# Patient Record
Sex: Female | Born: 1990 | Race: White | Hispanic: No | Marital: Single | Smoking: Former smoker
Health system: Southern US, Community
[De-identification: ages and names within clinical notes are randomized; demographics above are authoritative.]

## PROBLEM LIST (undated history)

## (undated) ENCOUNTER — Inpatient Hospital Stay (HOSPITAL_COMMUNITY): Payer: Self-pay

## (undated) DIAGNOSIS — M1990 Unspecified osteoarthritis, unspecified site: Secondary | ICD-10-CM

## (undated) DIAGNOSIS — I739 Peripheral vascular disease, unspecified: Secondary | ICD-10-CM

## (undated) DIAGNOSIS — Z9889 Other specified postprocedural states: Secondary | ICD-10-CM

## (undated) DIAGNOSIS — D649 Anemia, unspecified: Secondary | ICD-10-CM

## (undated) DIAGNOSIS — R51 Headache: Secondary | ICD-10-CM

## (undated) DIAGNOSIS — F32A Depression, unspecified: Secondary | ICD-10-CM

## (undated) DIAGNOSIS — O2690 Pregnancy related conditions, unspecified, unspecified trimester: Secondary | ICD-10-CM

## (undated) DIAGNOSIS — R112 Nausea with vomiting, unspecified: Secondary | ICD-10-CM

## (undated) DIAGNOSIS — F419 Anxiety disorder, unspecified: Secondary | ICD-10-CM

## (undated) DIAGNOSIS — O24419 Gestational diabetes mellitus in pregnancy, unspecified control: Secondary | ICD-10-CM

## (undated) DIAGNOSIS — F329 Major depressive disorder, single episode, unspecified: Secondary | ICD-10-CM

## (undated) HISTORY — PX: OTHER SURGICAL HISTORY: SHX169

## (undated) HISTORY — DX: Gestational diabetes mellitus in pregnancy, unspecified control: O24.419

## (undated) HISTORY — PX: MOUTH SURGERY: SHX715

## (undated) HISTORY — DX: Other specified postprocedural states: Z98.890

## (undated) HISTORY — DX: Nausea with vomiting, unspecified: R11.2

## (undated) HISTORY — PX: ANKLE RECONSTRUCTION: SHX1151

---
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
## 2010-07-18 ENCOUNTER — Other Ambulatory Visit (HOSPITAL_COMMUNITY)
Admission: RE | Admit: 2010-07-18 | Discharge: 2010-07-18 | Disposition: A | Discharge status: Home / Self Care | Payer: Medicaid Other | Source: Ambulatory Visit | Attending: Obstetrics and Gynecology | Admitting: Obstetrics and Gynecology

## 2010-07-18 DIAGNOSIS — Z113 Encounter for screening for infections with a predominantly sexual mode of transmission: Secondary | ICD-10-CM | POA: Insufficient documentation

## 2010-07-18 DIAGNOSIS — Z124 Encounter for screening for malignant neoplasm of cervix: Secondary | ICD-10-CM | POA: Insufficient documentation

## 2010-08-01 LAB — ABO/RH: RH Type: POSITIVE

## 2010-08-01 LAB — ANTIBODY SCREEN: Antibody Screen: NEGATIVE

## 2010-08-01 LAB — HIV ANTIBODY (ROUTINE TESTING W REFLEX): HIV: NONREACTIVE

## 2010-08-01 LAB — RUBELLA ANTIBODY, IGM: Rubella: IMMUNE

## 2010-08-01 NOTE — Op Note (Signed)
NAME:  Dominique Schaefer, Dominique Schaefer NO.:  [id number]   MEDICAL RECORD NO.:  [id number]          PATIENT TYPE:  AMB   LOCATION:  DSC                          FACILITY:  [facility]   PHYSICIAN:  Leonides Grills, M.D.     DATE OF BIRTH:  08-Jun-1990   DATE OF PROCEDURE:  08/27/2006  DATE OF DISCHARGE:                               OPERATIVE REPORT   PREOPERATIVE DIAGNOSIS:  1. Right chronic ankle instability.  2. Right ankle impingement.  3. 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   POSTOPERATIVE DIAGNOSIS:  1. Right chronic ankle instability.  2. Right ankle impingement.  3. 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   OPERATION:  1. Right modified Brostrom procedure.  2. Right ankle arthroscopy with extensive debridement.  3. Right peroneus longus to peroneus brevis tendon transfer.  4. Right peroneus brevis to peroneus longus tenodesis.   ANESTHESIA:  General.   SURGEON:  Leonides Grills, M.D.   ASSISTANT:  None.   COMPLICATIONS:  None.   TOURNIQUET TIME:  Approximately 1 hour 20 minutes.   DISPOSITION:  Stable to the PR.   INDICATIONS:  This is this an 20 year old female who has had long-  standing ankle instability to the point where she cannot trust her ankle  and has had chronic pain.  By physical exam, she has had a peroneus  longus overdrive which tips her hindfoot into varus and she does have a  borderline cavus foot. She was consented for the above procedure.  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   DESCRIPTION OF PROCEDURE:  The patient was brought to the operating and  placed initially in the supine position. After adequate general  endotracheal tube anesthesia was administered with block as well as  Ancef 1  gram IV piggyback, the patient was then placed in the sloppy  lateral position with the operative side up on a beanbag and all bony  prominence well padded.  The left lower extremity was prepped and draped  in a sterile manner to a proximally placed thigh tourniquet. Anatomical  landmarks to include anterior tibialis tendon, peroneus tertius, and  superficial peroneal nerve were mapped out.   A spinal needle was then placed just medial to the anterior tibialis  tendon. 20 mL of normal saline was instilled into the ankle and nick and  spread technique was then utilized to create the anteromedial portal.  A  blunt tip trocar with cannula followed by camera was then placed into  the ankle and, under direct visualization, the anterolateral portal was  created with a spinal needle followed by a nick and spread technique  lateral to the peroneus tertius tendon and superficial peroneal nerve.  The skin was illuminated from inside out to insure no injury to the  superficial peroneal nerve.  There was a large amount of synovitis in  the anterolateral aspect of the ankle around the accessory  tib-fib  ligament, extending into the syndesmotic recess.  An extensive  debridement was performed of this ligament as well as the synovitis in  this area.  Once this was completely removed, we then visualized  anteromedially by placing a camera in the anterolateral portal and  visualizing anteromedially.  There was no osteochondral lesions and  minimal synovitis that was debrided.  There was no obvious osteochondral  lesions.  Pictures were obtained throughout the procedure. The camera  was removed and the wound was closed with 4-0 nylon stitch.   The limb was gravity exsanguinated and tourniquet was elevated to 290  mmHg. A curvilinear incision was then made centered on the lateral  malleolus.  Dissection was carried down through skin.  Hemostasis was  obtained. The superficial peroneal nerve was identified  and retracted  out of harm's way.  The sural nerve was not identified and was not in  the way of the dissection. A small longitudinal incision was then made  approximately 2 cm from the tip of the lateral malleolus into the  peroneal retinaculum. Careful dissection was carried down to the  peroneus brevis and peroneus longus tendon.  There was no obvious tear  within either tendon.  The peroneus brevis was then transferred to the  peroneus longus using 2-0 FiberWire stitch.  This had an Interior and spatial designer. The peroneus longus was then tenotomized distal to the transfer  and, with the ankle in neutral dorsiflexion, a peroneus brevis and  peroneus longus tenodesis was performed using 2-0 FiberWire stitch.  This had an Conservation officer, historic buildings.  The area was copiously irrigated with  normal saline.  The retinaculum was repaired with 2-0 Vicryl stitch.  This had an Conservation officer, historic buildings.   We then extended the dissection to the distal aspect of the curvilinear  incision. This was made approximately 2 cm distal to the edge of the  anterolateral aspect of the ankle. A 2 mm cuff of capsule was preserved  and a capsulotomy was then made in the anterolateral aspect of the  ankle. There was no distinct anterior talofibular ligament that was  identified and the calcaneal fibular ligament appeared to be intact. We  then created a trough in the anterior aspect of the lateral malleolus  extending to its tip.  This was done with a curved 1/4 inch osteotome  and rongeur. We then placed three bioabsorbable [employer] main suture  anchors with 2-0 FiberWire stitch into the trough using a 2 mm drill.  Once this was done, we freed up the remaining portion of the ligament  and with the ankle in neutral dorsiflexion and eversion, advanced the  capsule ligament into this trough using the 2-0 FiberWire stitch  anchors. This had an Conservation officer, historic buildings. We mobilized, also, the extensor retinaculum and advanced this to the  proximal portion of the  capsular attachment and incorporated this into the repair, as well, with  2-0 FiberWire stitch, as well.  Again, this had an outstanding repair.  We ranged the ankle and had excellent range of motion, inversion,  dorsiflexion, had excellent endpoint, as well.   At this point, the tourniquet was deflated.  Hemostasis was obtained.  The subcu was closed with 3-0 Vicryl and the skin was closed with 4-0  nylon.  A sterile dressing was applied.  A modified Jones dressing was  applied with the ankle in neutral dorsiflexion and eversion. The patient  was stable to the PR.   POSTOP COURSE:  The patient will follow-up in  two weeks. At that time,  we will remove the dressing as well as suture.  We will carefully keep  the ankle in dorsiflexion and eversion. We will place her in a short leg  non-weight bearing cast with the ankle in neutral dorsiflexion eversion  which she will wear for another month non-weight bearing. In six weeks  postop, we will remove the cast and have her undergo a progressive  weight bearing program in a Cam walker boot and start therapy for  active/passive range of motion exercise of the ankle and subtalar joint  with  therapy and strengthening, as well.  At ten weeks postop, she will go  into an ASO brace and undergo more aggressive proprioception and  strengthening, wean herself out of the ASO brace as strengthen and  proprioception improve. Elevation and active range of motion of the toes  are encouraged.      Leonides Grills, M.D.  Electronically Signed     PB/MEDQ  D:  08/27/2006  T:  08/27/2006  Job:  [id number]

## 2010-09-01 DIAGNOSIS — R109 Unspecified abdominal pain: Secondary | ICD-10-CM

## 2010-09-01 DIAGNOSIS — O26899 Other specified pregnancy related conditions, unspecified trimester: Secondary | ICD-10-CM

## 2010-09-12 ENCOUNTER — Other Ambulatory Visit: Payer: Self-pay | Admitting: Obstetrics and Gynecology

## 2010-09-12 DIAGNOSIS — R772 Abnormality of alphafetoprotein: Secondary | ICD-10-CM

## 2010-09-26 ENCOUNTER — Ambulatory Visit (HOSPITAL_COMMUNITY)
Admission: RE | Admit: 2010-09-26 | Discharge: 2010-09-26 | Disposition: A | Discharge status: Home / Self Care | Payer: Medicaid Other | Source: Ambulatory Visit | Attending: Obstetrics and Gynecology | Admitting: Obstetrics and Gynecology

## 2010-09-26 ENCOUNTER — Other Ambulatory Visit: Payer: Self-pay | Admitting: Obstetrics and Gynecology

## 2010-09-26 ENCOUNTER — Encounter (HOSPITAL_COMMUNITY): Payer: Self-pay

## 2010-09-26 DIAGNOSIS — O358XX0 Maternal care for other (suspected) fetal abnormality and damage, not applicable or unspecified: Secondary | ICD-10-CM | POA: Insufficient documentation

## 2010-09-26 DIAGNOSIS — R772 Abnormality of alphafetoprotein: Secondary | ICD-10-CM

## 2010-09-26 DIAGNOSIS — Z363 Encounter for antenatal screening for malformations: Secondary | ICD-10-CM | POA: Insufficient documentation

## 2010-09-26 DIAGNOSIS — Z0489 Encounter for examination and observation for other specified reasons: Secondary | ICD-10-CM

## 2010-09-26 DIAGNOSIS — Z1389 Encounter for screening for other disorder: Secondary | ICD-10-CM | POA: Insufficient documentation

## 2010-09-26 HISTORY — DX: Pregnancy related conditions, unspecified, unspecified trimester: O26.90

## 2010-09-27 DIAGNOSIS — O351XX0 Maternal care for (suspected) chromosomal abnormality in fetus, not applicable or unspecified: Secondary | ICD-10-CM | POA: Insufficient documentation

## 2010-09-27 NOTE — Progress Notes (Signed)
Genetic Counseling  High-Risk Gestation Note  Appointment Date:  09/26/10 Referred By: Fortino Sic, MD Date of Birth:  06/22/1990 Partner:  Dominique Schaefer met with [name] and her partner, Dominique Schaefer, given a 1 in 59 Down syndrome risk from Quad screening.   Both family histories were reviewed and found to be contributory for the patient's paternal first cousin once removed born with bilateral absent feet.  This cousin is reportedly otherwise healthy. An underlying cause was not known at the time for her features. Additionally, the patient reported that her brother has a history of 6 pregnancy losses, with two different partners. An underlying cause is not known. There can be various underlying causes for recurrent pregnancy loss. We discussed that an underlying cause may or may not have implications for relatives and that additional information is needed. Without further information regarding the provided family history, an accurate genetic risk cannot be calculated. However, we discussed that targeted ultrasound is available to assess feet prenatally. Ultrasound cannot detect all birth defects of genetic conditions prenatally.  Further genetic counseling is warranted if more information is obtained.  They were then counseled that the results of the patient's Quad screening indicated a 1 in 66 chance for Down syndrome in the pregnancy, which is increased above the patient's age-related risk. We discussed that the Quad screen is able to adjust a person's baseline (age related) chance for specific chromosome conditions and for open neural tube defects, but is not diagnostic. Additionally, the results of the Quad screen were screen negative for trisomy 18 and ONTDs. We discussed chromosomes and nondisjunction. We specifically discussed Down syndrome (trisomy 21), including the common features and prognosis.       They were counseled regarding options of ultrasound and amniocentesis.   It was  discussed that not all birth defects can be identified with these procedures. They were counseled that 50-80% of fetuses with Down syndrome, when well visualized, have detectable anomalies or soft markers by ultrasound.    A risk of 1 in 200-300 was given for amniocentesis, the primary complication being spontaneous pregnancy loss.  They indicated that they understood these risks and decided to continue with ultrasound but declined amniocentesis at the time of today's visit. Ultrasound today indicated the pregnancy to be 23 weeks and 3 days gestation. Visualized fetal anatomy appeared normal. However, fetal spine was suboptimally visualized. Follow-up ultrasound was planned in 3 weeks to complete fetal anatomic survey. The couple stated that they would like to have the information provided by amniocentesis but wanted to take additional time to consider amniocentesis, given the associated risk of complications. They understand that although the ultrasound may appear normal, the risk of anomalies cannot be completely eliminated.   The patient denied exposure to environmental toxins or chemical agents.  She denied the current use of alcohol, tobacco or street drugs. She reported smoking cessation prior to the second trimester. She denied significant viral illnesses during the course of her pregnancy.  Her medical and surgical history were noncontributory.   The patient was provided written information which discussed cystic fibrosis (CF) including: the features of CF, the incidence of 1 in 3300 in the Caucasian population, autosomal recessive inheritance, and the 25% chance of having a baby with CF if both parents are carriers of CF.  Also discussed was the option of carrier testing including the pros and cons of carrier testing, as well as the option of prenatal testing if needed. The patient declined additional  discussion of CF carrier testing at this time.   A complete obstetrical ultrasound was performed at  the time of today's evaluation.  The ultrasound report is reported separately.      We counseled the patient for approximately 30 minutes regarding the above risks and available options.     Dominique Braun Kashmir Leedy, MS, [facility] 09/27/2010

## 2010-09-28 ENCOUNTER — Encounter (HOSPITAL_COMMUNITY): Payer: Self-pay

## 2010-10-17 ENCOUNTER — Ambulatory Visit (HOSPITAL_COMMUNITY)
Admission: RE | Admit: 2010-10-17 | Discharge: 2010-10-17 | Disposition: A | Discharge status: Home / Self Care | Payer: Medicaid Other | Source: Ambulatory Visit | Attending: Obstetrics and Gynecology | Admitting: Obstetrics and Gynecology

## 2010-10-17 ENCOUNTER — Encounter (HOSPITAL_COMMUNITY): Payer: Self-pay

## 2010-10-17 ENCOUNTER — Other Ambulatory Visit: Payer: Self-pay | Admitting: Obstetrics and Gynecology

## 2010-10-17 DIAGNOSIS — Z0489 Encounter for examination and observation for other specified reasons: Secondary | ICD-10-CM

## 2010-10-17 DIAGNOSIS — Z3689 Encounter for other specified antenatal screening: Secondary | ICD-10-CM | POA: Insufficient documentation

## 2010-10-17 DIAGNOSIS — O3510X0 Maternal care for (suspected) chromosomal abnormality in fetus, unspecified, not applicable or unspecified: Secondary | ICD-10-CM | POA: Insufficient documentation

## 2010-10-17 DIAGNOSIS — O2690 Pregnancy related conditions, unspecified, unspecified trimester: Secondary | ICD-10-CM

## 2010-10-17 DIAGNOSIS — O351XX0 Maternal care for (suspected) chromosomal abnormality in fetus, not applicable or unspecified: Secondary | ICD-10-CM | POA: Insufficient documentation

## 2010-10-31 ENCOUNTER — Other Ambulatory Visit (HOSPITAL_COMMUNITY): Payer: Medicaid Other

## 2010-11-01 ENCOUNTER — Ambulatory Visit (HOSPITAL_COMMUNITY)
Admission: RE | Admit: 2010-11-01 | Discharge: 2010-11-01 | Disposition: A | Discharge status: Home / Self Care | Payer: Medicaid Other | Source: Ambulatory Visit | Attending: Obstetrics and Gynecology | Admitting: Obstetrics and Gynecology

## 2010-11-01 VITALS — BP 139/74 | HR 93 | Wt 185.0 lb

## 2010-11-01 DIAGNOSIS — O3510X0 Maternal care for (suspected) chromosomal abnormality in fetus, unspecified, not applicable or unspecified: Secondary | ICD-10-CM | POA: Insufficient documentation

## 2010-11-01 DIAGNOSIS — O351XX0 Maternal care for (suspected) chromosomal abnormality in fetus, not applicable or unspecified: Secondary | ICD-10-CM | POA: Insufficient documentation

## 2010-11-01 DIAGNOSIS — O2690 Pregnancy related conditions, unspecified, unspecified trimester: Secondary | ICD-10-CM

## 2010-11-01 NOTE — ED Notes (Signed)
Rx called in to [facility] on [address] in [place] [phone]) per Dr. Sherrie George.   Prometrium 200mg , one per vagina daily #30 with one refill.

## 2010-11-01 NOTE — Progress Notes (Signed)
See ultrasound report Vital signs reviewed

## 2010-12-01 ENCOUNTER — Inpatient Hospital Stay (HOSPITAL_COMMUNITY)
Admission: AD | Admit: 2010-12-01 | Discharge: 2010-12-02 | Disposition: A | Discharge status: Home / Self Care | Payer: Medicaid Other | Source: Ambulatory Visit | Attending: Obstetrics and Gynecology | Admitting: Obstetrics and Gynecology

## 2010-12-01 ENCOUNTER — Encounter (HOSPITAL_COMMUNITY): Payer: Self-pay | Admitting: *Deleted

## 2010-12-01 DIAGNOSIS — R12 Heartburn: Secondary | ICD-10-CM | POA: Insufficient documentation

## 2010-12-01 DIAGNOSIS — R109 Unspecified abdominal pain: Secondary | ICD-10-CM

## 2010-12-01 DIAGNOSIS — O99891 Other specified diseases and conditions complicating pregnancy: Secondary | ICD-10-CM | POA: Insufficient documentation

## 2010-12-01 DIAGNOSIS — O26899 Other specified pregnancy related conditions, unspecified trimester: Secondary | ICD-10-CM

## 2010-12-01 LAB — URINALYSIS, ROUTINE W REFLEX MICROSCOPIC
Glucose, UA: 100 mg/dL — AB
Hgb urine dipstick: NEGATIVE
Ketones, ur: NEGATIVE mg/dL
Protein, ur: NEGATIVE mg/dL
pH: 6 (ref 5.0–8.0)

## 2010-12-01 MED ORDER — RANITIDINE HCL 150 MG PO TABS: 150.0000 mg | ORAL_TABLET | Freq: Two times a day (BID) | ORAL | Status: DC

## 2010-12-01 NOTE — Progress Notes (Signed)
G1 at 33wks. Some pain in upper abd and also in lower abd. Abd is more "sore" now than anything with occ cramping. Sometimes "burns" in lower abdomen "internally".

## 2010-12-01 NOTE — ED Provider Notes (Signed)
History     Chief Complaint  Patient presents with  . Abdominal Pain   HPI Dominique Schaefer 20 y.o. 32w 6d gestation has had upper and lower abdominal pain periodically today.  Was severe in car coming to [facility] but currently no pain but abdomen seems sore to the touch.  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   OB History    Grav Para Term Preterm Abortions TAB SAB Ect Mult Living   2 0 0 0 1 1 0 0 0 0       Past Medical History  Diagnosis Date  . Pregnancy complication     Increased risk of Down Syn with Quad screen.  . No pertinent past medical history     Past Surgical History  Procedure Date  . Ankle reconstruction     bilateral ankle surgeries from athletic injuries  . Mouth surgery     No family history on file.  History  Substance Use Topics  . Smoking status: Former Games developer  . Smokeless tobacco: Not on file  . Alcohol Use: No    Allergies: No Known Allergies  Prescriptions prior to admission  Medication Sig Dispense Refill  . acetaminophen (TYLENOL) 500 MG tablet Take 500 mg by mouth every 6 (six) hours as needed. For pain       . Calcium Carbonate Antacid (TUMS PO) Take 2 tablets by mouth daily.        . Fe Fum-FePoly-FA-Vit C-Vit B3 (INTEGRA F PO) Take 1 tablet by mouth 3 (three) times daily.        . IRON COMBINATIONS PO Take by mouth.        . ondansetron (ZOFRAN-ODT) 4 MG disintegrating tablet Take 4 mg by mouth every 8 (eight) hours as needed. For nausea       . prenatal vitamin w/FE, FA (PRENATAL 1 + 1) 27-1 MG TABS Take 1 tablet by mouth daily.        . progesterone (PROMETRIUM) 200 MG capsule Take 200 mg by mouth daily.        . promethazine (PHENERGAN) 25 MG tablet Take 25 mg by mouth every 6 (six) hours as needed.        Marland Kitchen PRENATAL VITAMINS PO Take by mouth.          Review of Systems  Gastrointestinal: Positive for heartburn, vomiting and abdominal pain.  Genitourinary:       No vaginal bleeding.  No leaking.   Physical Exam   Blood pressure 143/74,  pulse 99, temperature 98.5 F (36.9 C), temperature source Oral, resp. rate 20, height 5' (1.524 m), weight 190 lb 6.4 oz (86.365 kg).  Physical Exam  Nursing note and vitals reviewed. Constitutional: She is oriented to person, place, and time. She appears well-developed and well-nourished.  HENT:  Head: Normocephalic.  Eyes: EOM are normal.  Neck: Neck supple.  GI: Soft.       FHT baseline 145.  15x15 accelerations noted - Reactive strip.  No contractions on strip and no contractions palpated by examiner.  No contractions or pain noted by client.  Musculoskeletal: Normal range of motion.  Neurological: She is alert and oriented to person, place, and time.  Skin: Skin is warm and dry.  Psychiatric: She has a normal mood and affect.    MAU Course  Procedures Results for orders placed during the hospital encounter of 12/01/10 (from the past 24 hour(s))  URINALYSIS, ROUTINE W REFLEX MICROSCOPIC     Status: Abnormal   Collection Time  12/01/10 10:42 PM      Component Value Range   Color, Urine YELLOW  YELLOW    Appearance CLEAR  CLEAR    Specific Gravity, Urine <1.005 (*) 1.005 - 1.030    pH 6.0  5.0 - 8.0    Glucose, UA 100 (*) NEGATIVE (mg/dL)   Hgb urine dipstick NEGATIVE  NEGATIVE    Bilirubin Urine NEGATIVE  NEGATIVE    Ketones, ur NEGATIVE  NEGATIVE (mg/dL)   Protein, ur NEGATIVE  NEGATIVE (mg/dL)   Urobilinogen, UA 0.2  0.0 - 1.0 (mg/dL)   Nitrite NEGATIVE  NEGATIVE    Leukocytes, UA NEGATIVE  NEGATIVE     MDM Consult with Dr. Neva Seat and reviewed plan of care  Assessment and Plan  Abdominal pain vs. Threatened labor Heartburn  Plan: No contractions and will send home Rx zantac 150 mg PO bid for heartburn as she reports having acid come out of her nose when awakening in the mornings.  Dominique Schaefer 12/01/2010, 11:06 PM   Nolene Bernheim, NP 12/01/10 [id number]  Nolene Bernheim, NP 12/01/10 2354

## 2011-01-04 LAB — POCT HEMOGLOBIN-HEMACUE: Operator id: 116011

## 2011-01-24 ENCOUNTER — Telehealth (HOSPITAL_COMMUNITY): Payer: Self-pay | Admitting: *Deleted

## 2011-01-24 NOTE — Telephone Encounter (Signed)
Preadmission screen  

## 2011-01-26 ENCOUNTER — Telehealth (HOSPITAL_COMMUNITY): Payer: Self-pay | Admitting: *Deleted

## 2011-01-26 ENCOUNTER — Encounter (HOSPITAL_COMMUNITY): Payer: Self-pay | Admitting: *Deleted

## 2011-01-26 NOTE — Telephone Encounter (Signed)
Preadmission screen  

## 2011-01-30 ENCOUNTER — Inpatient Hospital Stay (HOSPITAL_COMMUNITY)
Admission: RE | Admit: 2011-01-30 | Discharge: 2011-02-01 | DRG: 774 | Disposition: A | Discharge status: Home / Self Care | Payer: Medicaid Other | Source: Ambulatory Visit | Attending: Obstetrics and Gynecology | Admitting: Obstetrics and Gynecology

## 2011-01-30 ENCOUNTER — Encounter (HOSPITAL_COMMUNITY): Payer: Self-pay | Admitting: Anesthesiology

## 2011-01-30 ENCOUNTER — Encounter (HOSPITAL_COMMUNITY): Payer: Self-pay

## 2011-01-30 ENCOUNTER — Inpatient Hospital Stay (HOSPITAL_COMMUNITY): Payer: Medicaid Other | Admitting: Anesthesiology

## 2011-01-30 DIAGNOSIS — O480 Post-term pregnancy: Principal | ICD-10-CM | POA: Diagnosis present

## 2011-01-30 LAB — CBC
Hemoglobin: 10.4 g/dL — ABNORMAL LOW (ref 12.0–15.0)
MCH: 29.3 pg (ref 26.0–34.0)
MCHC: 33.5 g/dL (ref 30.0–36.0)
MCV: 87.4 fL (ref 78.0–100.0)
Platelets: 251 10*3/uL (ref 150–400)
Platelets: 270 10*3/uL (ref 150–400)
RBC: 3.5 MIL/uL — ABNORMAL LOW (ref 3.87–5.11)
RBC: 4.06 MIL/uL (ref 3.87–5.11)
WBC: 20 10*3/uL — ABNORMAL HIGH (ref 4.0–10.5)

## 2011-01-30 MED ORDER — ONDANSETRON HCL 4 MG PO TABS: 4.0000 mg | ORAL_TABLET | ORAL | Status: DC | PRN

## 2011-01-30 MED ORDER — LIDOCAINE HCL 1.5 % IJ SOLN
INTRAMUSCULAR | Status: DC | PRN
  Administered 2011-01-30: 5 mL via INTRADERMAL
  Administered 2011-01-30: 2 mL via INTRADERMAL
  Administered 2011-01-30: 5 mL via INTRADERMAL

## 2011-01-30 MED ORDER — TERBUTALINE SULFATE 1 MG/ML IJ SOLN: 0.2500 mg | Freq: Once | INTRAMUSCULAR | Status: DC | PRN

## 2011-01-30 MED ORDER — OXYTOCIN BOLUS FROM INFUSION
500.0000 mL | Freq: Once | INTRAVENOUS | Status: DC
  Filled 2011-01-30: qty 500

## 2011-01-30 MED ORDER — FLEET ENEMA 7-19 GM/118ML RE ENEM: 1.0000 | ENEMA | RECTAL | Status: DC | PRN

## 2011-01-30 MED ORDER — SIMETHICONE 80 MG PO CHEW: 80.0000 mg | CHEWABLE_TABLET | ORAL | Status: DC | PRN

## 2011-01-30 MED ORDER — LACTATED RINGERS IV SOLN: INTRAVENOUS | Status: DC

## 2011-01-30 MED ORDER — LACTATED RINGERS IV SOLN
500.0000 mL | INTRAVENOUS | Status: DC | PRN
  Administered 2011-01-30: 1000 mL via INTRAVENOUS

## 2011-01-30 MED ORDER — OXYTOCIN 20 UNITS IN LACTATED RINGERS INFUSION - SIMPLE: 125.0000 mL/h | Freq: Once | INTRAVENOUS | Status: DC

## 2011-01-30 MED ORDER — EPHEDRINE 5 MG/ML INJ: 10.0000 mg | INTRAVENOUS | Status: DC | PRN

## 2011-01-30 MED ORDER — LIDOCAINE HCL (PF) 1 % IJ SOLN: 30.0000 mL | INTRAMUSCULAR | Status: DC | PRN

## 2011-01-30 MED ORDER — WITCH HAZEL-GLYCERIN EX PADS: 1.0000 "application " | MEDICATED_PAD | CUTANEOUS | Status: DC | PRN

## 2011-01-30 MED ORDER — BENZOCAINE-MENTHOL 20-0.5 % EX AERO: 1.0000 "application " | INHALATION_SPRAY | CUTANEOUS | Status: DC | PRN

## 2011-01-30 MED ORDER — ACETAMINOPHEN 325 MG PO TABS: 650.0000 mg | ORAL_TABLET | ORAL | Status: DC | PRN

## 2011-01-30 MED ORDER — FERROUS SULFATE 325 (65 FE) MG PO TABS
325.0000 mg | ORAL_TABLET | Freq: Two times a day (BID) | ORAL | Status: DC
  Administered 2011-01-31 – 2011-02-01 (×3): 325 mg via ORAL
  Filled 2011-01-30 (×3): qty 1

## 2011-01-30 MED ORDER — EPHEDRINE 5 MG/ML INJ
10.0000 mg | INTRAVENOUS | Status: DC | PRN
  Filled 2011-01-30: qty 4

## 2011-01-30 MED ORDER — PHENYLEPHRINE 40 MCG/ML (10ML) SYRINGE FOR IV PUSH (FOR BLOOD PRESSURE SUPPORT): 80.0000 ug | PREFILLED_SYRINGE | INTRAVENOUS | Status: DC | PRN

## 2011-01-30 MED ORDER — SENNOSIDES-DOCUSATE SODIUM 8.6-50 MG PO TABS
2.0000 | ORAL_TABLET | Freq: Every day | ORAL | Status: DC
  Administered 2011-01-30 – 2011-01-31 (×2): 2 via ORAL

## 2011-01-30 MED ORDER — METHYLERGONOVINE MALEATE 0.2 MG PO TABS
0.2000 mg | ORAL_TABLET | Freq: Once | ORAL | Status: AC
  Administered 2011-01-31: 0.2 mg via ORAL
  Filled 2011-01-30: qty 1

## 2011-01-30 MED ORDER — BUTORPHANOL TARTRATE 2 MG/ML IJ SOLN: 1.0000 mg | INTRAMUSCULAR | Status: DC | PRN

## 2011-01-30 MED ORDER — IBUPROFEN 600 MG PO TABS: 600.0000 mg | ORAL_TABLET | Freq: Four times a day (QID) | ORAL | Status: DC | PRN

## 2011-01-30 MED ORDER — BENZOCAINE-MENTHOL 20-0.5 % EX AERO
INHALATION_SPRAY | CUTANEOUS | Status: AC
  Administered 2011-01-30: 23:00:00
  Filled 2011-01-30: qty 56

## 2011-01-30 MED ORDER — PHENYLEPHRINE 40 MCG/ML (10ML) SYRINGE FOR IV PUSH (FOR BLOOD PRESSURE SUPPORT)
80.0000 ug | PREFILLED_SYRINGE | INTRAVENOUS | Status: DC | PRN
  Filled 2011-01-30: qty 5

## 2011-01-30 MED ORDER — LANOLIN HYDROUS EX OINT: TOPICAL_OINTMENT | CUTANEOUS | Status: DC | PRN

## 2011-01-30 MED ORDER — DIBUCAINE 1 % RE OINT
1.0000 "application " | TOPICAL_OINTMENT | RECTAL | Status: DC | PRN
  Administered 2011-01-31: 1 via RECTAL
  Filled 2011-01-30: qty 28

## 2011-01-30 MED ORDER — ONDANSETRON HCL 4 MG/2ML IJ SOLN: 4.0000 mg | INTRAMUSCULAR | Status: DC | PRN

## 2011-01-30 MED ORDER — IBUPROFEN 600 MG PO TABS
600.0000 mg | ORAL_TABLET | Freq: Four times a day (QID) | ORAL | Status: DC
  Administered 2011-01-31 – 2011-02-01 (×6): 600 mg via ORAL
  Filled 2011-01-30 (×8): qty 1

## 2011-01-30 MED ORDER — ZOLPIDEM TARTRATE 5 MG PO TABS: 5.0000 mg | ORAL_TABLET | Freq: Every evening | ORAL | Status: DC | PRN

## 2011-01-30 MED ORDER — LACTATED RINGERS IV SOLN
500.0000 mL | Freq: Once | INTRAVENOUS | Status: AC
  Administered 2011-01-30: 1000 mL via INTRAVENOUS

## 2011-01-30 MED ORDER — OXYCODONE-ACETAMINOPHEN 5-325 MG PO TABS
1.0000 | ORAL_TABLET | ORAL | Status: DC | PRN
  Administered 2011-01-31 (×2): 1 via ORAL
  Filled 2011-01-30 (×2): qty 1

## 2011-01-30 MED ORDER — TETANUS-DIPHTH-ACELL PERTUSSIS 5-2.5-18.5 LF-MCG/0.5 IM SUSP: 0.5000 mL | Freq: Once | INTRAMUSCULAR | Status: DC

## 2011-01-30 MED ORDER — PRENATAL PLUS 27-1 MG PO TABS
1.0000 | ORAL_TABLET | Freq: Every day | ORAL | Status: DC
  Administered 2011-02-01: 1 via ORAL
  Filled 2011-01-30 (×3): qty 1

## 2011-01-30 MED ORDER — LIDOCAINE HCL (PF) 1 % IJ SOLN
INTRAMUSCULAR | Status: AC
  Filled 2011-01-30: qty 30

## 2011-01-30 MED ORDER — CITRIC ACID-SODIUM CITRATE 334-500 MG/5ML PO SOLN: 30.0000 mL | ORAL | Status: DC | PRN

## 2011-01-30 MED ORDER — DIPHENHYDRAMINE HCL 50 MG/ML IJ SOLN: 12.5000 mg | INTRAMUSCULAR | Status: DC | PRN

## 2011-01-30 MED ORDER — DIPHENHYDRAMINE HCL 25 MG PO CAPS: 25.0000 mg | ORAL_CAPSULE | Freq: Four times a day (QID) | ORAL | Status: DC | PRN

## 2011-01-30 MED ORDER — OXYCODONE-ACETAMINOPHEN 5-325 MG PO TABS
2.0000 | ORAL_TABLET | ORAL | Status: DC | PRN
  Administered 2011-01-30: 1 via ORAL
  Filled 2011-01-30: qty 1

## 2011-01-30 MED ORDER — OXYTOCIN 20 UNITS IN LACTATED RINGERS INFUSION - SIMPLE
1.0000 m[IU]/min | INTRAVENOUS | Status: DC
  Administered 2011-01-30: 2 m[IU]/min via INTRAVENOUS
  Filled 2011-01-30: qty 1000

## 2011-01-30 MED ORDER — ONDANSETRON HCL 4 MG/2ML IJ SOLN: 4.0000 mg | Freq: Four times a day (QID) | INTRAMUSCULAR | Status: DC | PRN

## 2011-01-30 MED ORDER — FENTANYL 2.5 MCG/ML BUPIVACAINE 1/10 % EPIDURAL INFUSION (WH - ANES)
14.0000 mL/h | INTRAMUSCULAR | Status: DC
  Administered 2011-01-30: 12 mL/h via EPIDURAL
  Filled 2011-01-30: qty 60

## 2011-01-30 MED ORDER — METHYLERGONOVINE MALEATE 0.2 MG/ML IJ SOLN
INTRAMUSCULAR | Status: AC
  Administered 2011-01-30: 0.2 mg via INTRAMUSCULAR
  Filled 2011-01-30: qty 1

## 2011-01-30 NOTE — H&P (Signed)
Admission H&P:  Patient is a 20 y.o. G2P0010 at [redacted]w[redacted]d who presented for a post-dates induction.  Patient admitted and was without complaint.  No vb, lof, occ ctx, + fm.    PMH:  No significant pmh. PSH:  Tonsillectomy and ankle surgery. PGYN:  No h/o HSV, std's, or abnormal paps. POB:  TAB x1.   Meds:  PNV, Iron sulfate Allergies:  NKDA.  PE:    AFVSS Cervix:  On admission, 4-5 cm.  On my exam, 5 cm/80/-1 to -2.  Cephalic. Reactive NST. Regular ctx on 8 mu of pitocin at time of exam.   AROM note:  Fetal head well applied with small bulging bag.  AROM performed without difficulty with fluid lightly-stained with meconium.  Fetus tolerated well.    A+/RI/NR/pap wnl.  A/P:  Post dates induction.  Began pitocin.  Patient may have epidural and IV meds prn.  Reassuring maternofetal status.

## 2011-01-30 NOTE — Progress Notes (Signed)
Delivery note:    Preop:  Postdates pregnancy, induction of labor. Postop:  Same, postpartum hemorrhage. Procedure:  SVD/2nd Attending:  Paul Half Anesthesia:  Epidural Comps:  Postpartum hemorrhage treated with a dose of methergine, pitocin, and vigorous fundal massage. Path:  None. EBL:  550. Findings:  Viable infant female with Apgars of 8 at one minute and 9 at five minutes.  Weight 8lb 12 oz.  Length 20 inches.  Placenta with 3vc inspected and noted to be intact.  2nd degree laceration repaired with 2.0 vicryl per routine.  Exam after repair revealed large blood clots in uterus which were expressed with massage.  No evidence of sponges in vagina.  Mother and baby stable after delivery.  Local lidocaine injected for anesthesia.  Right periurethral laceration hemostatic and not repaired.

## 2011-01-30 NOTE — Anesthesia Preprocedure Evaluation (Signed)
Anesthesia Evaluation  Patient identified by MRN, date of birth, ID band Patient awake    Reviewed: Allergy & Precautions, H&P , NPO status , Patient's Chart, lab work & pertinent test results, reviewed documented beta blocker date and time   History of Anesthesia Complications (+) PONV, PROLONGED EMERGENCE and Family history of anesthesia reaction  Airway Mallampati: I TM Distance: >3 FB Neck ROM: full    Dental  (+) Teeth Intact   Pulmonary former smoker clear to auscultation        Cardiovascular neg cardio ROS regular Normal    Neuro/Psych Negative Neurological ROS  Negative Psych ROS   GI/Hepatic negative GI ROS, Neg liver ROS,   Endo/Other  Morbid obesity  Renal/GU negative Renal ROS  Genitourinary negative   Musculoskeletal   Abdominal   Peds  Hematology negative hematology ROS (+)   Anesthesia Other Findings   Reproductive/Obstetrics (+) Pregnancy                           Anesthesia Physical Anesthesia Plan  ASA: III  Anesthesia Plan: Epidural   Post-op Pain Management:    Induction:   Airway Management Planned:   Additional Equipment:   Intra-op Plan:   Post-operative Plan:   Informed Consent: I have reviewed the patients History and Physical, chart, labs and discussed the procedure including the risks, benefits and alternatives for the proposed anesthesia with the patient or authorized representative who has indicated his/her understanding and acceptance.     Plan Discussed with:   Anesthesia Plan Comments:         Anesthesia Quick Evaluation

## 2011-01-30 NOTE — Anesthesia Procedure Notes (Signed)
Epidural Patient location during procedure: OB Start time: 01/30/2011 2:12 PM Reason for block: procedure for pain  Staffing Performed by: anesthesiologist   Preanesthetic Checklist Completed: patient identified, site marked, surgical consent, pre-op evaluation, timeout performed, IV checked, risks and benefits discussed and monitors and equipment checked  Epidural Patient position: sitting Prep: site prepped and draped and DuraPrep Patient monitoring: continuous pulse ox and blood pressure Approach: midline Injection technique: LOR air  Needle:  Needle type: Tuohy  Needle gauge: 17 G Needle length: 9 cm Needle insertion depth: 5 cm cm Catheter type: closed end flexible Catheter size: 19 Gauge Catheter at skin depth: 10 cm Test dose: negative  Assessment Events: blood not aspirated, injection not painful, no injection resistance, negative IV test and no paresthesia  Additional Notes Discussed risk of headache, infection, bleeding, nerve injury and failed or incomplete block.  Patient voices understanding and wishes to proceed.

## 2011-01-31 LAB — CBC
HCT: 29.6 % — ABNORMAL LOW (ref 36.0–46.0)
MCV: 87.3 fL (ref 78.0–100.0)
RBC: 3.39 MIL/uL — ABNORMAL LOW (ref 3.87–5.11)
WBC: 18.6 10*3/uL — ABNORMAL HIGH (ref 4.0–10.5)

## 2011-01-31 NOTE — Progress Notes (Signed)
UR chart review completed.  

## 2011-01-31 NOTE — Progress Notes (Signed)
PPD#1:  Patient without complaints.  Pain controlled.  Bleeding stable.    AFVSS Fundus firm below umbilicus NT lower ext; homan's neg.  Crit:  29.5.  A/P:  PPD #1 s/p SVD/2nd Routine care today. Anticipate d/c tomorrow. Is breastfeeding.

## 2011-01-31 NOTE — Anesthesia Postprocedure Evaluation (Signed)
  Anesthesia Post-op Note  Patient: Dominique Schaefer  Procedure(s) Performed: * No procedures listed *  Patient Location: Mother/Baby  Anesthesia Type: Epidural  Level of Consciousness: awake, alert  and oriented  Airway and Oxygen Therapy: Patient Spontanous Breathing  Post-op Pain: mild  Post-op Assessment: Post-op Vital signs reviewed  Post-op Vital Signs: stable  Complications: No apparent anesthesia complications

## 2011-01-31 NOTE — Progress Notes (Signed)
Patient was referred for history of depression/anxiety. * Referral screened out by Clinical Social Worker because none of the following criteria appear to apply:  ~ History of anxiety/depression during this pregnancy, or of post-partum       depression.  ~ Diagnosis of anxiety and/or depression within last 3 years  ~ History of depression due to pregnancy loss/loss of child  OR * Patient's symptoms currently being treated with medication and/or therapy.  Please contact the Clinical Social Worker if needs arise, or by the patient's request.  Pt was never diagnosed or treated for anxiety.  She has not experienced any symptoms in "years," as per pt. 

## 2011-02-01 ENCOUNTER — Encounter (HOSPITAL_COMMUNITY): Payer: Self-pay

## 2011-02-01 NOTE — Progress Notes (Signed)
UR Chart review completed.  

## 2011-02-02 NOTE — Progress Notes (Signed)
D/C summary dictated.  [id number]

## 2011-02-02 NOTE — Progress Notes (Signed)
Delayed Entry:  Patient seen in p.m. Of 11/15.  Patient without complaints.  Occ cramping.  Is breastfeeding.  Discussed contraception.  Bleeding decreased.  Desires d/c.  AFVSS Fundus firm, below umbilicus.   NT LE's, with neg homans.  No evid DVT.  A/P:  PPD 2 s/p SVD/2nd.  D/C home today.  F/u in 6 wks for pp visit.

## 2011-02-03 NOTE — Discharge Summary (Signed)
NAME:  Dominique Schaefer, DEVITA NO.:  [id number]  MEDICAL RECORD NO.:  [id number]  LOCATION:  9133                          FACILITY:  [facility]  PHYSICIAN:  Pricilla Holm, MD      DATE OF BIRTH:  Mar 31, 1990  DATE OF ADMISSION:  01/30/2011 DATE OF DISCHARGE:  02/01/2011                              DISCHARGE SUMMARY   PRINCIPAL DIAGNOSIS:  Pregnancy at 41 weeks, 3 days, the patient presents for postdate induction.  HISTORY OF PRESENT ILLNESS:  Please see typed H and P for further details.  HOSPITAL COURSE:  The patient was admitted on January 30, 2011, for a postdate induction.  The patient was already noted in the clinic to have been 4 cm.  On presentation, she was 4-5 cm and her membranes were found to be intact.  The patient was started on Pitocin.  The patient's amnion was artificially ruptured revealing a light amount of meconium.  The patient proceeded through labor without any difficulty.  She did receive an epidural and progressed to 10 cm.  She has been labored down.  She began to have an increasing sensation and urge to push and successfully delivered a 8-pound 12-ounce neonate with Apgars of 8 at 1 minute and 9 at 5 minutes.  The patient did have heavy amounts of bleeding during the immediate period after delivery of the placenta.  This was treated with a single dose of Methergine as well as vigorous fundal massage. Estimated blood loss was approximately 500 mL.  However, the patient did well postpartum.  She had a postpartum hematocrit, which was 29.5. During her postpartum course, the patient remained afebrile with stable vital signs.  The patient's exam was reassuring.  The patient was breastfeeding and the plan was to discharge the patient on postpartum day 2.  The patient and I discussed options for contraception.  She felt that the IUD would be most appropriate.  She will consider being Mirena versus the ParaGard IUD.  She will call in several weeks to  schedule appointment for both the postpartum visit as well as an IUD placement. The patient was given prescriptions for Motrin and Nestabs with DHA. The patient was to continue taking the Nestabs as long as she is breastfeeding.  The patient was discharged in stable conditions. Precautions were given to the patient verbally.  She was discharged from the hospital in stable condition.          ______________________________ Pricilla Holm, MD     RB/MEDQ  D:  02/02/2011  T:  02/02/2011  Job:  [id number]

## 2011-02-06 ENCOUNTER — Inpatient Hospital Stay (HOSPITAL_COMMUNITY)
Admission: AD | Admit: 2011-02-06 | Discharge: 2011-02-07 | Disposition: A | Discharge status: Home / Self Care | Payer: Medicaid Other | Source: Ambulatory Visit | Attending: Obstetrics and Gynecology | Admitting: Obstetrics and Gynecology

## 2011-02-06 DIAGNOSIS — R109 Unspecified abdominal pain: Secondary | ICD-10-CM | POA: Insufficient documentation

## 2011-02-06 DIAGNOSIS — O99893 Other specified diseases and conditions complicating puerperium: Secondary | ICD-10-CM | POA: Insufficient documentation

## 2011-02-07 ENCOUNTER — Encounter (HOSPITAL_COMMUNITY): Payer: Self-pay | Admitting: *Deleted

## 2011-02-07 ENCOUNTER — Inpatient Hospital Stay (HOSPITAL_COMMUNITY): Payer: Medicaid Other

## 2011-02-07 LAB — DIFFERENTIAL
Basophils Relative: 1 % (ref 0–1)
Eosinophils Absolute: 0.5 10*3/uL (ref 0.0–0.7)
Monocytes Absolute: 1 10*3/uL (ref 0.1–1.0)
Monocytes Relative: 9 % (ref 3–12)

## 2011-02-07 LAB — URINALYSIS, ROUTINE W REFLEX MICROSCOPIC
Ketones, ur: NEGATIVE mg/dL
Nitrite: NEGATIVE
Specific Gravity, Urine: 1.015 (ref 1.005–1.030)
Urobilinogen, UA: 0.2 mg/dL (ref 0.0–1.0)
pH: 6 (ref 5.0–8.0)

## 2011-02-07 LAB — URINE MICROSCOPIC-ADD ON

## 2011-02-07 LAB — COMPREHENSIVE METABOLIC PANEL
Albumin: 2.9 g/dL — ABNORMAL LOW (ref 3.5–5.2)
BUN: 11 mg/dL (ref 6–23)
Creatinine, Ser: 0.61 mg/dL (ref 0.50–1.10)
Total Bilirubin: 0.1 mg/dL — ABNORMAL LOW (ref 0.3–1.2)
Total Protein: 6.6 g/dL (ref 6.0–8.3)

## 2011-02-07 LAB — LIPASE, BLOOD: Lipase: 37 U/L (ref 11–59)

## 2011-02-07 LAB — CBC
HCT: 31.7 % — ABNORMAL LOW (ref 36.0–46.0)
Hemoglobin: 10.5 g/dL — ABNORMAL LOW (ref 12.0–15.0)
MCH: 29.2 pg (ref 26.0–34.0)
MCHC: 33.1 g/dL (ref 30.0–36.0)
MCV: 88.1 fL (ref 78.0–100.0)

## 2011-02-07 NOTE — Progress Notes (Signed)
Pt went to md office yesterday afternoon.  States she had a sharp pain on right side that is unrelieved with pain meds.  States she has been having some nausea. md said she did have an odor to her lochia but he wasn't concerned.  Saw Dr. Paul Half.

## 2011-02-07 NOTE — Progress Notes (Signed)
Dr. Neva Seat informed of lab & U/S results, urine culture ordered, DC order obtained.  Pt to F/U in office on Monday.

## 2011-02-07 NOTE — Progress Notes (Signed)
PT SAYS SHE DEL VAG ON 01-30-2011--   BREAST FEEDING.     SAYS HAS BEEN HAVING SEVER PAIN ON R SIDE-  WENT TO  DR Paul Half- TODAY- RX FOR PAIN AND  ANTIBX- AND SAID  IF NOT BETTER COME TO [facility]U.  TOOK VICODIN AT  2300 -  NO RELIEF.     FEELS  NAUSEA.    ATE LAST  9PM.     SAYS LOCHIA OK.- HAD PELVIC EXAM TODAY AND SAID D/C HAD ODOR AND WAS WATERY.

## 2011-02-07 NOTE — Progress Notes (Signed)
[place] tech back to pick up pt.  States radiologist wants repeat pelvic US. Pt back to [place] at this time.

## 2011-02-07 NOTE — ED Provider Notes (Signed)
History     CSN: [id number] Arrival date & time: 02/06/2011 11:53 PM   None     No chief complaint on file.  HPI Dominique Schaefer is a 20 y.o. female who presents to [facility]U for lower abdominal pain that started earlier today. S/P vaginal delivery 02/04/11. Was evaluated today in the office for the pain and given Vicodin and antibiotics.    Past Medical History  Diagnosis Date  . Pregnancy complication     Increased risk of Down Syn with Quad screen.  . No pertinent past medical history   . PONV (postoperative nausea and vomiting)     Past Surgical History  Procedure Date  . Ankle reconstruction     bilateral ankle surgeries from athletic injuries  . Mouth surgery     Family History  Problem Relation Age of Onset  . Anesthesia problems Paternal Uncle     has had recusitation    History  Substance Use Topics  . Smoking status: Former Smoker    Quit date: 05/26/2010  . Smokeless tobacco: Not on file  . Alcohol Use: No    OB History    Grav Para Term Preterm Abortions TAB SAB Ect Mult Living   2 1 1  0 1 1 0 0 0 1      Review of Systems  Constitutional: Positive for fever and chills. Negative for diaphoresis and fatigue.  HENT: Negative for ear pain, congestion, sore throat, facial swelling, neck pain, neck stiffness, dental problem and sinus pressure.   Eyes: Negative for photophobia, pain and discharge.  Respiratory: Negative for cough, chest tightness and wheezing.   Cardiovascular: Negative.   Gastrointestinal: Positive for nausea and abdominal pain. Negative for vomiting, diarrhea, constipation and abdominal distention.  Genitourinary: Negative for dysuria, frequency, flank pain and difficulty urinating.  Musculoskeletal: Negative for myalgias, back pain and gait problem.  Skin: Negative for color change and rash.  Neurological: Negative for dizziness, speech difficulty, weakness, light-headedness, numbness and headaches.  Psychiatric/Behavioral: Negative for  confusion and agitation.    Allergies  Review of patient's allergies indicates no known allergies.  Home Medications  No current outpatient prescriptions on file.  BP 124/53  Pulse 85  Temp(Src) 98.8 F (37.1 C) (Oral)  Resp 18  Ht 5\' 2"  (1.575 m)  Wt 190 lb 6 oz (86.354 kg)  BMI 34.82 kg/m2  Breastfeeding? Unknown  Physical Exam  Nursing note and vitals reviewed. Constitutional: She is oriented to person, place, and time. She appears well-developed and well-nourished.  HENT:  Head: Normocephalic.  Eyes: EOM are normal.  Neck: Neck supple.  Cardiovascular: Normal rate.   Pulmonary/Chest: Effort normal.  Abdominal: Soft. There is tenderness in the right upper quadrant and right lower quadrant.  Musculoskeletal: Normal range of motion.  Neurological: She is alert and oriented to person, place, and time. No cranial nerve deficit.  Skin: Skin is warm and dry.   Discussed with Dr. Neva Seat, labs and ultrasound ordered and RN to call with results.  Procedures  Ninfa Meeker, RN called results to Dr. Neva Seat and Dr. Neva Seat took over care of patient and gave orders to RN.  MDM          Kerrie Buffalo, NP 02/08/11 [phone]

## 2011-02-07 NOTE — Progress Notes (Signed)
H. Neese, NP at bedside.  Assessment done and poc discussed with pt.  

## 2011-02-08 LAB — URINE CULTURE: Culture  Setup Time: 201211211115

## 2011-04-06 ENCOUNTER — Encounter (HOSPITAL_COMMUNITY): Payer: Medicaid Other

## 2011-04-06 ENCOUNTER — Ambulatory Visit (HOSPITAL_COMMUNITY)
Admission: RE | Admit: 2011-04-06 | Discharge: 2011-04-06 | Disposition: A | Discharge status: Home / Self Care | Payer: Medicaid Other | Source: Ambulatory Visit | Attending: Obstetrics and Gynecology | Admitting: Obstetrics and Gynecology

## 2011-04-06 NOTE — Progress Notes (Signed)
Adult Lactation Consultation Outpatient Visit Note  Patient Name: Dominique Schaefer        BABY:  Dominique Schaefer Date of Birth: [birth year]               DOB:  [birth year]  BIRTH WEIGHT:  8-12  WEIGHT TODAY 11-2.3 Gestational Age at Delivery: TERM                  Type of Delivery: C/S  Breastfeeding History: Frequency of Breastfeeding: EVERY 2-3 HOURS DURING THE DAY Length of Feeding: 20-30 MIN. Voids: QS Stools: QS  Supplementing / Method:   2-4 OZ OF EBM/FORMULA PC PER BOTTLE EVERY 3 HOURS DURING THE DAY Pumping:  Type of Pump:LACTINA   Frequency:EVERY 3 HOURS  Volume:  30 MLS  Comments:    Consultation Evaluation:  MOTHER HERE WITH 72 WEEK OLD WITH C/O LOW MILK SUPPLY.  SHE REPORTS STARTING FORMULA SUPPLEMENTATION AT 3 WEEKS AS RECOMMENDED BY MD DUE TO WEIGHT LOSS.  MOTHER STATES SHE HAS NEEDED TO GIVEN INCREASING AMOUNTS OF FORMULA.  WEIGHT GAIN HAS SINCE BEEN ADEQUATE.  MOTHER CALLED OUR OFFICE A WEEK AGO AND IT WAS RECOMMENDED THAT SHE INCREASE HER PUMPING AND BEGIN FENUGREEK.  SHE REPORTS FEELING THIS HAS HELPED SOMEWHAT AND SHE CAN SOMETIMES GIVE LESS FORMULA.  HER GOAL IS TO BREASTFEED AT LEAST 6 MONTHS.  BABY OBSERVED AT THE BREAST USING A 24 MM NIPPLE SHIELD.  BABY COULD INITIALLY OBTAIN DEEP LATCH BUT PULLED OFF FREQUENTLY.  SMALL AMNT OF MILK PRESENT IN SHIELD BUT NO MILK TRANSFER.  ATTEMPTED TO LATCH BABY WITHOUT SHIELD WITHOUT SUCCESS.  SNS DISCUSSED WITH MOTHER AND SHE WAS WILLING TO USE TO PROMOTE BABY STAYING ON BREAST. BABY LATCHED WITH NIPPLE SHIELD AND SNS AND STAYED ON FOR 15 MIN TAKING 60 MLS OF FORMULA FROM SNS.  REGULAR SNS USED.  PLAN IS TO BREASTFEED EVERY 3 HOURS WITH SNS, PUMP X 15 MINUTES AFTER FEEDS, START MORINGA AS DIRECTED. Initial Feeding Assessment: Pre-feed Weight: Post-feed Weight: Amount Transferred: Comments:  Additional Feeding Assessment: Pre-feed Weight: Post-feed Weight: Amount Transferred: Comments:  Additional Feeding Assessment: Pre-feed  Weight: Post-feed Weight: Amount Transferred: Comments:  Total Breast milk Transferred this Visit:  Total Supplement Given:   Additional Interventions:   Follow-Up  WILL CALL us PRN      Dominique Schaefer, Dominique Schaefer 04/06/2011, 2:52 PM

## 2011-04-12 ENCOUNTER — Other Ambulatory Visit (HOSPITAL_COMMUNITY): Payer: Self-pay | Admitting: Obstetrics and Gynecology

## 2011-04-12 NOTE — H&P (Signed)
Dominique Schaefer is an 21 y.o. female.G2 P1 A1 who delivered a healthy viable female on 01/30/2011. A  Mirena IUD was inserted at 6 weeks postpartum by Dr. Paul Half.  Dominique Schaefer came into the office on 04/12/11 for a post insertion follow up and no string was visualized.  An ultrasound was done and the IUD was found in the cul de sac behind her  retroverted uterus.  The patient was informed of the findings.  The plan is to retrieve the displaced IUD laparoscopically and place a new IUD under ultrasonic guidance.  The risks, possible complications of the planned procedures have been discussed with the patient and informed consent has been given.    Pertinent Gynecological History: Menses: regular every 30 days without intermenstrual spotting Bleeding: no Contraception: none DES exposure: denies Blood transfusions: none Sexually transmitted diseases: no past history Previous GYN Procedures: no  Last mammogram: na Date: na Last pap: normal Date: May 2012 OB History: G2, P1 A1   Menstrual History: Menarche age:65 03/11/11    Past Medical History  Diagnosis Date  . Pregnancy complication     Increased risk of Down Syn with Quad screen.  . No pertinent past medical history   . PONV (postoperative nausea and vomiting)     Past Surgical History  Procedure Date  . Ankle reconstruction     bilateral ankle surgeries from athletic injuries  . Mouth surgery     Family History  Problem Relation Age of Onset  . Anesthesia problems Paternal Uncle     has had recusitation    Social History:  reports that she quit smoking about 10 months ago. She does not have any smokeless tobacco history on file. She reports that she does not drink alcohol or use illicit drugs.  Allergies: No Known Allergies   (Not in a hospital admission)  Review of Systems  Constitutional: Negative.   HENT: Negative.   Eyes: Negative.   Respiratory: Negative.   Cardiovascular: Negative.   Gastrointestinal: Negative.     Genitourinary: Negative.   Musculoskeletal: Negative.   Skin: Negative.   Neurological: Negative.   Endo/Heme/Allergies: Negative.   Psychiatric/Behavioral: Negative.     There were no vitals taken for this visit. Physical Exam  Constitutional: She appears well-developed and well-nourished. No distress.  HENT:  Head: Normocephalic and atraumatic.  Eyes: Right eye exhibits no discharge. Left eye exhibits no discharge. No scleral icterus.  Neck: Normal range of motion. Neck supple. No JVD present. No tracheal deviation present. No thyromegaly present.  Cardiovascular: Normal rate, regular rhythm and intact distal pulses.  Exam reveals no gallop and no friction rub.   No murmur heard. Respiratory: Effort normal and breath sounds normal. No respiratory distress. She has no wheezes. She has no rales. She exhibits no tenderness.  GI: Soft. Bowel sounds are normal. She exhibits no distension and no mass. There is no tenderness. There is no rebound and no guarding.  Genitourinary: Vagina normal and uterus normal. No vaginal discharge found.  Musculoskeletal: Normal range of motion. She exhibits no edema and no tenderness.  Lymphadenopathy:    She has no cervical adenopathy.  Neurological: She is alert. No cranial nerve deficit. She exhibits normal muscle tone. Coordination normal.  Skin: Skin is warm and dry. No rash noted. She is not diaphoretic. No erythema. No pallor.  Psychiatric: She has a normal mood and affect. Her behavior is normal. Judgment and thought content normal.    No results found for this or any previous  visit (from the past 24 hour(s)).  No results found.  Assessment/Plan: Displaced IUD, merina P:  Laparoscopic retrieval of IUD and Mirena IUD insertion under ultrasonic guidance  Dominique Schaefer E 04/12/2011, 10:27 PM

## 2011-04-13 ENCOUNTER — Encounter (HOSPITAL_COMMUNITY): Payer: Self-pay

## 2011-04-13 ENCOUNTER — Encounter (HOSPITAL_COMMUNITY)
Admission: RE | Admit: 2011-04-13 | Discharge: 2011-04-13 | Disposition: A | Discharge status: Home / Self Care | Payer: Medicaid Other | Source: Ambulatory Visit | Attending: Obstetrics and Gynecology | Admitting: Obstetrics and Gynecology

## 2011-04-13 HISTORY — DX: Peripheral vascular disease, unspecified: I73.9

## 2011-04-13 HISTORY — DX: Unspecified osteoarthritis, unspecified site: M19.90

## 2011-04-13 HISTORY — DX: Headache: R51

## 2011-04-13 HISTORY — DX: Depression, unspecified: F32.A

## 2011-04-13 HISTORY — DX: Anxiety disorder, unspecified: F41.9

## 2011-04-13 HISTORY — DX: Major depressive disorder, single episode, unspecified: F32.9

## 2011-04-13 HISTORY — DX: Anemia, unspecified: D64.9

## 2011-04-13 LAB — DIFFERENTIAL
Eosinophils Relative: 2 % (ref 0–5)
Lymphocytes Relative: 22 % (ref 12–46)
Lymphs Abs: 2.5 10*3/uL (ref 0.7–4.0)
Neutro Abs: 8 10*3/uL — ABNORMAL HIGH (ref 1.7–7.7)
Neutrophils Relative %: 70 % (ref 43–77)

## 2011-04-13 LAB — SURGICAL PCR SCREEN
MRSA, PCR: NEGATIVE
Staphylococcus aureus: NEGATIVE

## 2011-04-13 LAB — CBC
MCV: 84 fL (ref 78.0–100.0)
Platelets: 395 10*3/uL (ref 150–400)
RBC: 4.69 MIL/uL (ref 3.87–5.11)
WBC: 11.4 10*3/uL — ABNORMAL HIGH (ref 4.0–10.5)

## 2011-04-13 NOTE — Patient Instructions (Signed)
   Your procedure is scheduled on:  Monday, Jan 28th  Enter through the Main Entrance of [facility] at: 1145am Pick up the phone at the desk and dial [phone] and inform us of your arrival.  Please call this number if you have any problems the morning of surgery: [phone]  Remember: Do not eat food after midnight: Sunday Do not drink clear liquids after: 9am Monday Take these medicines the morning of surgery with a SIP OF WATER: none  Do not wear jewelry, make-up, or FINGER nail polish Do not wear lotions, powders, perfumes or deodorant. Do not shave 48 hours prior to surgery. Do not bring valuables to the hospital.  Patients discharged on the day of surgery will not be allowed to drive home.   Home with Dominique Schaefer cell [phone]   Remember to use your hibiclens as instructed.Please shower with 1/2 bottle the evening before your surgery and the other 1/2 bottle the morning of surgery.

## 2011-04-13 NOTE — Pre-Procedure Instructions (Signed)
Ok to see patient DOS. 

## 2011-04-16 ENCOUNTER — Ambulatory Visit (HOSPITAL_COMMUNITY): Payer: Medicaid Other | Admitting: Anesthesiology

## 2011-04-16 ENCOUNTER — Ambulatory Visit (HOSPITAL_COMMUNITY): Payer: Medicaid Other

## 2011-04-16 ENCOUNTER — Encounter (HOSPITAL_COMMUNITY): Payer: Self-pay | Admitting: Anesthesiology

## 2011-04-16 ENCOUNTER — Ambulatory Visit (HOSPITAL_COMMUNITY)
Admission: RE | Admit: 2011-04-16 | Discharge: 2011-04-16 | Disposition: A | Discharge status: Home / Self Care | Payer: Medicaid Other | Source: Ambulatory Visit | Attending: Obstetrics and Gynecology | Admitting: Obstetrics and Gynecology

## 2011-04-16 ENCOUNTER — Encounter (HOSPITAL_COMMUNITY)
Admission: RE | Disposition: A | Discharge status: Home / Self Care | Payer: Self-pay | Source: Ambulatory Visit | Attending: Obstetrics and Gynecology

## 2011-04-16 ENCOUNTER — Other Ambulatory Visit: Payer: Self-pay | Admitting: Obstetrics and Gynecology

## 2011-04-16 ENCOUNTER — Encounter (HOSPITAL_COMMUNITY): Payer: Self-pay | Admitting: *Deleted

## 2011-04-16 DIAGNOSIS — Z9889 Other specified postprocedural states: Secondary | ICD-10-CM

## 2011-04-16 DIAGNOSIS — Z30430 Encounter for insertion of intrauterine contraceptive device: Secondary | ICD-10-CM | POA: Insufficient documentation

## 2011-04-16 DIAGNOSIS — T8339XA Other mechanical complication of intrauterine contraceptive device, initial encounter: Secondary | ICD-10-CM | POA: Insufficient documentation

## 2011-04-16 HISTORY — PX: LAPAROSCOPY: SHX197

## 2011-04-16 HISTORY — PX: IUD REMOVAL: SHX5392

## 2011-04-16 LAB — HCG, SERUM, QUALITATIVE: Preg, Serum: NEGATIVE

## 2011-04-16 SURGERY — LAPAROSCOPY OPERATIVE
Anesthesia: General | Site: Vagina | Wound class: Clean Contaminated

## 2011-04-16 MED ORDER — SCOPOLAMINE 1 MG/3DAYS TD PT72
1.0000 | MEDICATED_PATCH | TRANSDERMAL | Status: DC
  Administered 2011-04-16: 1.5 mg via TRANSDERMAL

## 2011-04-16 MED ORDER — KETOROLAC TROMETHAMINE 30 MG/ML IJ SOLN
INTRAMUSCULAR | Status: DC | PRN
  Administered 2011-04-16: 30 mg via INTRAVENOUS

## 2011-04-16 MED ORDER — DIPHENHYDRAMINE HCL 50 MG/ML IJ SOLN
INTRAMUSCULAR | Status: AC
  Filled 2011-04-16: qty 1

## 2011-04-16 MED ORDER — ROCURONIUM BROMIDE 100 MG/10ML IV SOLN
INTRAVENOUS | Status: DC | PRN
  Administered 2011-04-16: 5 mg via INTRAVENOUS
  Administered 2011-04-16: 25 mg via INTRAVENOUS

## 2011-04-16 MED ORDER — PROPOFOL 10 MG/ML IV EMUL
INTRAVENOUS | Status: AC
  Filled 2011-04-16: qty 20

## 2011-04-16 MED ORDER — FENTANYL CITRATE 0.05 MG/ML IJ SOLN
INTRAMUSCULAR | Status: AC
  Filled 2011-04-16: qty 5

## 2011-04-16 MED ORDER — CEFAZOLIN SODIUM 1-5 GM-% IV SOLN
INTRAVENOUS | Status: AC
  Filled 2011-04-16: qty 50

## 2011-04-16 MED ORDER — LIDOCAINE HCL (CARDIAC) 20 MG/ML IV SOLN
INTRAVENOUS | Status: DC | PRN
  Administered 2011-04-16 (×2): 60 mg via INTRAVENOUS

## 2011-04-16 MED ORDER — LACTATED RINGERS IV SOLN
INTRAVENOUS | Status: DC
  Administered 2011-04-16 (×2): via INTRAVENOUS

## 2011-04-16 MED ORDER — CEFAZOLIN SODIUM 1-5 GM-% IV SOLN
1.0000 g | INTRAVENOUS | Status: AC
  Administered 2011-04-16: 1 g via INTRAVENOUS

## 2011-04-16 MED ORDER — LIDOCAINE HCL (CARDIAC) 20 MG/ML IV SOLN
INTRAVENOUS | Status: AC
  Filled 2011-04-16: qty 5

## 2011-04-16 MED ORDER — ONDANSETRON HCL 4 MG/2ML IJ SOLN
INTRAMUSCULAR | Status: AC
  Filled 2011-04-16: qty 2

## 2011-04-16 MED ORDER — PROPOFOL 10 MG/ML IV EMUL
INTRAVENOUS | Status: DC | PRN
  Administered 2011-04-16: 180 mg via INTRAVENOUS

## 2011-04-16 MED ORDER — SCOPOLAMINE 1 MG/3DAYS TD PT72
MEDICATED_PATCH | TRANSDERMAL | Status: AC
  Administered 2011-04-16: 1.5 mg via TRANSDERMAL
  Filled 2011-04-16: qty 1

## 2011-04-16 MED ORDER — MIDAZOLAM HCL 2 MG/2ML IJ SOLN
INTRAMUSCULAR | Status: AC
  Filled 2011-04-16: qty 2

## 2011-04-16 MED ORDER — FENTANYL CITRATE 0.05 MG/ML IJ SOLN
INTRAMUSCULAR | Status: DC | PRN
  Administered 2011-04-16: 50 ug via INTRAVENOUS
  Administered 2011-04-16: 100 ug via INTRAVENOUS
  Administered 2011-04-16 (×2): 50 ug via INTRAVENOUS

## 2011-04-16 MED ORDER — MIDAZOLAM HCL 5 MG/5ML IJ SOLN
INTRAMUSCULAR | Status: DC | PRN
  Administered 2011-04-16: 2 mg via INTRAVENOUS

## 2011-04-16 MED ORDER — METOCLOPRAMIDE HCL 5 MG/ML IJ SOLN
INTRAMUSCULAR | Status: AC
  Filled 2011-04-16: qty 2

## 2011-04-16 MED ORDER — METOCLOPRAMIDE HCL 5 MG/ML IJ SOLN
INTRAMUSCULAR | Status: DC | PRN
  Administered 2011-04-16: 10 mg via INTRAVENOUS

## 2011-04-16 MED ORDER — KETOROLAC TROMETHAMINE 30 MG/ML IJ SOLN
INTRAMUSCULAR | Status: AC
  Filled 2011-04-16: qty 1

## 2011-04-16 MED ORDER — ONDANSETRON HCL 4 MG/2ML IJ SOLN
INTRAMUSCULAR | Status: DC | PRN
  Administered 2011-04-16: 4 mg via INTRAVENOUS

## 2011-04-16 MED ORDER — DEXAMETHASONE SODIUM PHOSPHATE 10 MG/ML IJ SOLN
INTRAMUSCULAR | Status: AC
  Filled 2011-04-16: qty 1

## 2011-04-16 MED ORDER — DEXAMETHASONE SODIUM PHOSPHATE 10 MG/ML IJ SOLN
INTRAMUSCULAR | Status: DC | PRN
  Administered 2011-04-16: 10 mg via INTRAVENOUS

## 2011-04-16 MED ORDER — DIPHENHYDRAMINE HCL 50 MG/ML IJ SOLN
INTRAMUSCULAR | Status: DC | PRN
  Administered 2011-04-16: 12.5 mg via INTRAVENOUS

## 2011-04-16 SURGICAL SUPPLY — 23 items
BAG SPEC RTRVL LRG 6X4 10 (ENDOMECHANICALS)
CABLE HIGH FREQUENCY MONO STRZ (ELECTRODE) IMPLANT
CLOTH BEACON ORANGE TIMEOUT ST (SAFETY) ×3 IMPLANT
DRSG COVERLET 3X3 (GAUZE/BANDAGES/DRESSINGS) ×1 IMPLANT
FORCEPS CUTTING 33CM 5MM (CUTTING FORCEPS) IMPLANT
GLOVE BIO SURGEON STRL SZ 6.5 (GLOVE) ×9 IMPLANT
GLOVE BIOGEL PI IND STRL 7.0 (GLOVE) ×4 IMPLANT
GLOVE BIOGEL PI INDICATOR 7.0 (GLOVE) ×2
GOWN PREVENTION PLUS LG XLONG (DISPOSABLE) ×3 IMPLANT
GOWN PREVENTION PLUS XLARGE (GOWN DISPOSABLE) ×3 IMPLANT
PACK LAPAROSCOPY BASIN (CUSTOM PROCEDURE TRAY) ×3 IMPLANT
POUCH SPECIMEN RETRIEVAL 10MM (ENDOMECHANICALS) IMPLANT
SET IRRIG TUBING LAPAROSCOPIC (IRRIGATION / IRRIGATOR) IMPLANT
SLEEVE Z-THREAD 5X100MM (TROCAR) IMPLANT
SUT VICRYL 0 UR6 27IN ABS (SUTURE) ×3 IMPLANT
SUT VICRYL 4-0 PS2 18IN ABS (SUTURE) ×3 IMPLANT
TOWEL OR 17X24 6PK STRL BLUE (TOWEL DISPOSABLE) ×6 IMPLANT
TRAY FOLEY CATH 14FR (SET/KITS/TRAYS/PACK) ×3 IMPLANT
TROCAR Z-THREAD FIOS 11X100 BL (TROCAR) IMPLANT
TROCAR Z-THREAD FIOS 5X100MM (TROCAR) ×3 IMPLANT
WARMER LAPAROSCOPE (MISCELLANEOUS) ×3 IMPLANT
WATER STERILE IRR 1000ML POUR (IV SOLUTION) ×3 IMPLANT
mirena intra uterine device ×1 IMPLANT

## 2011-04-16 NOTE — Anesthesia Postprocedure Evaluation (Deleted)
Anesthesia Post Note  Patient: Dominique Schaefer  Procedure(s) Performed:  LAPAROSCOPY OPERATIVE - Removal of Intra Uterine Device ; INTRAUTERINE DEVICE (IUD) REMOVAL - marina placement under ultrasound guidance  Anesthesia type: GA  Patient location: PACU  Post pain: Pain level controlled  Post assessment: Post-op Vital signs reviewed  Last Vitals:  Filed Vitals:   04/16/11 1154  BP: 113/76  Pulse: 78  Temp: 36.7 C  Resp: 16    Post vital signs: Reviewed  Level of consciousness: sedated  Complications: No apparent anesthesia complications

## 2011-04-16 NOTE — Transfer of Care (Signed)
Immediate Anesthesia Transfer of Care Note  Patient: Dominique Schaefer  Procedure(s) Performed:  LAPAROSCOPY OPERATIVE - Removal of Intra Uterine Device ; INTRAUTERINE DEVICE (IUD) REMOVAL - marina placement under ultrasound guidance  Patient Location: PACU  Anesthesia Type: General  Level of Consciousness: alert  and oriented  Airway & Oxygen Therapy: Patient Spontanous Breathing and Patient connected to nasal cannula oxygen  Post-op Assessment: Report given to PACU RN and Post -op Vital signs reviewed and stable  Post vital signs: stable  Complications: No apparent anesthesia complications

## 2011-04-16 NOTE — Op Note (Signed)
04/16/2011  1:54 PM  PATIENT:  Dominique Schaefer  21 y.o. female with an extra uterine IUD.  Plan is for laparoscopic removal of the displaced IUD followed by insertion of a replacement IUD  Under ultrasonic guidance.  Risks, possible complications explained.  Informed consent was given.    PRE-OPERATIVE DIAGNOSIS:  displaced Intra Uterine Device, Contraception POST-OPERATIVE DIAGNOSIS:  displaced Intra Uterine Device, Placement of Intra Uterine Device  PROCEDURE:  Procedure(s): Operative laparoscopy with removal of IUD from the cul de sac, insertion of Mirena IUD under ultrasonic guidane  Details of Prodedure:   The patient was placed on the table in a supine position Gen. Anesthesia was induced. The patient was then placed in the dorsal lithotomy position. The abdomen and vagina were sterilely prepped and draped in the usual fashion. A Hulka tenaculum was placed on the cervix and we went above. This was after sounding the uterus. The uterus sounded to 6 the uterus to 6 mm and it was retroverted we then went above. An infraumbilical incision was made. A trocar was inserted without difficulty. Then the laparoscope was placed. We confirmed that when the peritoneal cavity. And the CO2 was insufflated. We were able to visualize the Mirena IUD in the posterior cul-de-sac.  No other abnormalities were seen. Using the operative scope we were able to  removed the.The strings were grasped and the IUD pulled up through the laparoscope. To close the laparoscopic incision, 0 vicryl was placed in the fascia.  4 O vicryl was used to close the skin in a subcuticular fashion.  Next we made preparations to put in a new IUD.  The ultrasound machine was brought into the operating room.  We located the retroverted uterus.  The Merina IUD was inserted without difficulty using ultrasonic guidance.  We were able to confirm that the replacement Mirena IUD was centrally located into the uterine cavity. At this point all instruments  were removed and the patient was taken to recovery in good condition.  SURGEON:  Surgeon(s): Fortino Sic, MD  PHYSICIAN ASSISTANT:  None     ANESTHESIA:   general  EBL:  Total I/O In: 1400 [I.V.:1400] Out: 110 [Urine:100; Blood:10]  BLOOD ADMINISTERED:none  DRAINS: none   LOCAL MEDICATIONS USED:  NONE  SPECIMEN: IUD, mirena, endometrium, intra uterine curretings  DISPOSITION OF SPECIMEN:  PATHOLOGY  COUNTS:  YES  TOURNIQUET:  * No tourniquets in log *  DICTATION: .Note written in EPIC  PLAN OF CARE: Discharge to home after PACU  PATIENT DISPOSITION:  PACU - hemodynamically stable.   Delay start of Pharmacological VTE agent (>24hrs) due to surgical blood loss or risk of bleeding:  {YES/NO/NOT APPLICABLE:20182

## 2011-04-16 NOTE — Anesthesia Postprocedure Evaluation (Signed)
Anesthesia Post Note  Patient: Dominique Schaefer  Procedure(s) Performed:  LAPAROSCOPY OPERATIVE - Removal of Intra Uterine Device ; INTRAUTERINE DEVICE (IUD) REMOVAL - marina placement under ultrasound guidance  Anesthesia type: GA  Patient location: PACU  Post pain: Pain level controlled  Post assessment: Post-op Vital signs reviewed  Last Vitals:  Filed Vitals:   04/16/11 1356  BP:   Pulse: 96  Temp: 36.8 C  Resp: 16    Post vital signs: Reviewed  Level of consciousness: sedated  Complications: No apparent anesthesia complications

## 2011-04-16 NOTE — Anesthesia Preprocedure Evaluation (Signed)
Anesthesia Evaluation  Patient identified by MRN, date of birth, ID band Patient awake    Reviewed: Allergy & Precautions, H&P , Patient's Chart, lab work & pertinent test results, reviewed documented beta blocker date and time   History of Anesthesia Complications (+) PONV  Airway Mallampati: III TM Distance: >3 FB Neck ROM: full    Dental No notable dental hx.    Pulmonary neg pulmonary ROS,  clear to auscultation  Pulmonary exam normal       Cardiovascular Exercise Tolerance: Good neg cardio ROS regular Normal    Neuro/Psych  Headaches, PSYCHIATRIC DISORDERS Negative Neurological ROS  Negative Psych ROS   GI/Hepatic negative GI ROS, Neg liver ROS,   Endo/Other  Negative Endocrine ROS  Renal/GU negative Renal ROS     Musculoskeletal   Abdominal   Peds  Hematology negative hematology ROS (+)   Anesthesia Other Findings Distal blood clot  Reproductive/Obstetrics negative OB ROS                           Anesthesia Physical Anesthesia Plan  ASA: II  Anesthesia Plan: General ETT   Post-op Pain Management:    Induction:   Airway Management Planned:   Additional Equipment:   Intra-op Plan:   Post-operative Plan:   Informed Consent: I have reviewed the patients History and Physical, chart, labs and discussed the procedure including the risks, benefits and alternatives for the proposed anesthesia with the patient or authorized representative who has indicated his/her understanding and acceptance.   Dental Advisory Given  Plan Discussed with: CRNA and Surgeon  Anesthesia Plan Comments:         Anesthesia Quick Evaluation

## 2011-04-18 ENCOUNTER — Encounter (HOSPITAL_COMMUNITY): Payer: Self-pay | Admitting: Obstetrics and Gynecology

## 2011-08-27 ENCOUNTER — Other Ambulatory Visit (HOSPITAL_COMMUNITY)
Admission: RE | Admit: 2011-08-27 | Discharge: 2011-08-27 | Disposition: A | Discharge status: Home / Self Care | Payer: Medicaid Other | Source: Ambulatory Visit | Attending: Obstetrics & Gynecology | Admitting: Obstetrics & Gynecology

## 2011-08-27 ENCOUNTER — Other Ambulatory Visit: Payer: Self-pay | Admitting: Obstetrics & Gynecology

## 2011-08-27 DIAGNOSIS — Z124 Encounter for screening for malignant neoplasm of cervix: Secondary | ICD-10-CM | POA: Insufficient documentation

## 2013-12-09 ENCOUNTER — Encounter (HOSPITAL_COMMUNITY): Payer: Self-pay | Admitting: *Deleted

## 2013-12-09 ENCOUNTER — Inpatient Hospital Stay (HOSPITAL_COMMUNITY)
Admission: AD | Admit: 2013-12-09 | Discharge: 2013-12-10 | Disposition: A | Discharge status: Home / Self Care | Payer: Medicaid Other | Source: Ambulatory Visit | Attending: Obstetrics and Gynecology | Admitting: Obstetrics and Gynecology

## 2013-12-09 DIAGNOSIS — O210 Mild hyperemesis gravidarum: Secondary | ICD-10-CM | POA: Diagnosis not present

## 2013-12-09 DIAGNOSIS — Z87891 Personal history of nicotine dependence: Secondary | ICD-10-CM | POA: Diagnosis not present

## 2013-12-09 DIAGNOSIS — O219 Vomiting of pregnancy, unspecified: Secondary | ICD-10-CM

## 2013-12-09 LAB — URINALYSIS, ROUTINE W REFLEX MICROSCOPIC
Bilirubin Urine: NEGATIVE
Glucose, UA: NEGATIVE mg/dL
Hgb urine dipstick: NEGATIVE
Ketones, ur: NEGATIVE mg/dL
NITRITE: NEGATIVE
PH: 5.5 (ref 5.0–8.0)
Protein, ur: NEGATIVE mg/dL
UROBILINOGEN UA: 0.2 mg/dL (ref 0.0–1.0)

## 2013-12-09 LAB — URINE MICROSCOPIC-ADD ON

## 2013-12-09 LAB — POCT PREGNANCY, URINE: PREG TEST UR: POSITIVE — AB

## 2013-12-09 MED ORDER — PROMETHAZINE HCL 25 MG/ML IJ SOLN
25.0000 mg | Freq: Once | INTRAVENOUS | Status: AC
  Administered 2013-12-09: 25 mg via INTRAVENOUS
  Filled 2013-12-09: qty 1

## 2013-12-09 NOTE — MAU Note (Signed)
Pt reports vomiting with pregnancy, worsening over the last 2 days. [facility] 07/18/2014

## 2013-12-09 NOTE — MAU Provider Note (Signed)
History     CSN: [id number]  Arrival date and time: 12/09/13 2121   First Provider Initiated Contact with Patient 12/09/13 2301      Chief Complaint  Patient presents with  . Emesis   HPI Dominique Schaefer is a 23 y.o. G3P1011 at [redacted]w[redacted]d who presents to MAU today with complaint of N/V. She states that this has been consistent throughout the pregnancy but worse x 2 days. She states that she was on Diclegis and it was not helping, only making her tired. She was given Reglan and feels that she is less nauseous, but still has had multiple episodes of vomiting today. Her last dose was at 1500 today. She states one episode of emesis since then. She denies abdominal pain or vaginal bleeding today.   OB History   Grav Para Term Preterm Abortions TAB SAB Ect Mult Living   0 1 1 0 0 0 1      Past Medical History  Diagnosis Date  . Pregnancy complication     Increased risk of Down Syn with Quad screen.  Marland Kitchen PONV (postoperative nausea and vomiting)   . Peripheral vascular disease     left ankle - blood clot per pt- placed on aspirin  x 1 wk.  . Anemia     hx - no meds  . Headache(784.0)     otc meds prn  . Arthritis     bilateral ankles  . Anxiety   . Depression     Past Surgical History  Procedure Laterality Date  . Ankle reconstruction      bilateral ankle surgeries from athletic injuries  . Mouth surgery      palate  . Svd      x 1  . Laparoscopy  04/16/2011    Procedure: LAPAROSCOPY OPERATIVE;  Surgeon: Fortino Sic, MD;  Location: [facility];  Service: Gynecology;  Laterality: N/A;  Removal of Intra Uterine Device   . Iud removal  04/16/2011    Procedure: INTRAUTERINE DEVICE (IUD) REMOVAL;  Surgeon: Fortino Sic, MD;  Location: [facility];  Service: Gynecology;  Laterality: N/A;  marina placement under ultrasound guidance    Family History  Problem Relation Age of Onset  . Anesthesia problems Paternal Uncle     has had recusitation    History  Substance Use Topics   . Smoking status: Former Smoker -- 0.25 packs/day for 4 years    Types: Cigarettes    Quit date: 05/26/2010  . Smokeless tobacco: Never Used  . Alcohol Use: No    Allergies:  Allergies  Allergen Reactions  . Other Anaphylaxis    States is allergic to Bees    Prescriptions prior to admission  Medication Sig Dispense Refill  . metoCLOPramide (REGLAN) 10 MG tablet Take 10 mg by mouth. Every 8 hours.      . Prenatal Vit-Fe Fumarate-FA (PRENATAL MULTIVITAMIN) TABS tablet Take 1 tablet by mouth daily at 12 noon.      Marland Kitchen PRESCRIPTION MEDICATION Diclegis for nausea      . sertraline (ZOLOFT) 100 MG tablet Take 100 mg by mouth daily.        Review of Systems  Constitutional: Negative for fever and malaise/fatigue.  Gastrointestinal: Positive for nausea and vomiting. Negative for abdominal pain, diarrhea and constipation.  Genitourinary: Negative for dysuria, urgency and frequency.       Neg - vaginal bleeding, discharge  Neurological: Positive for dizziness and weakness. Negative for loss of consciousness.  Physical Exam   Blood pressure 111/66, pulse 70, temperature 98.6 F (37 C), temperature source Oral, resp. rate 18, height 5' 0.5" (1.537 m), weight 186 lb (84.369 kg), SpO2 100.00%, currently breastfeeding.  Physical Exam  Constitutional: She is oriented to person, place, and time. She appears well-developed and well-nourished. No distress.  HENT:  Head: Normocephalic.  Cardiovascular: Normal rate.   Respiratory: Effort normal.  GI: Soft. She exhibits no distension and no mass. There is no tenderness. There is no rebound, no guarding and no CVA tenderness.  Neurological: She is alert and oriented to person, place, and time.  Skin: Skin is warm and dry. No erythema.   Results for orders placed during the hospital encounter of 12/09/13 (from the past 24 hour(s))  URINALYSIS, ROUTINE W REFLEX MICROSCOPIC     Status: Abnormal   Collection Time    12/09/13  9:40 PM       Result Value Ref Range   Color, Urine YELLOW  YELLOW   APPearance HAZY (*) CLEAR   Specific Gravity, Urine >1.030 (*) 1.005 - 1.030   pH 5.5  5.0 - 8.0   Glucose, UA NEGATIVE  NEGATIVE mg/dL   Hgb urine dipstick NEGATIVE  NEGATIVE   Bilirubin Urine NEGATIVE  NEGATIVE   Ketones, ur NEGATIVE  NEGATIVE mg/dL   Protein, ur NEGATIVE  NEGATIVE mg/dL   Urobilinogen, UA 0.2  0.0 - 1.0 mg/dL   Nitrite NEGATIVE  NEGATIVE   Leukocytes, UA SMALL (*) NEGATIVE  URINE MICROSCOPIC-ADD ON     Status: Abnormal   Collection Time    12/09/13  9:40 PM      Result Value Ref Range   Squamous Epithelial / LPF FEW (*) RARE   WBC, UA 0-2  <3 WBC/hpf   RBC / HPF 0-2  <3 RBC/hpf   Bacteria, UA FEW (*) RARE   Urine-Other YEAST    POCT PREGNANCY, URINE     Status: Abnormal   Collection Time    12/09/13  9:53 PM      Result Value Ref Range   Preg Test, Ur POSITIVE (*) NEGATIVE    MAU Course  Procedures None  MDM UA today Discussed patient and labs with Dr. Dorita Fray. He states that patient was just given Reglan today and that dose at 1500 was first and only dose so far.  Recommends 1 liter IV fluid bolus with Phenergan and continued Reglan and Diclegis PO outpatient. Follow-up in the office as scheduled or sooner if symptoms persist or worsen Patient able to tolerate PO in MAU today No emesis in MAU Assessment and Plan  A: Nausea and vomiting in pregnancy  P: Discharge home Patient advised to continue Reglan and Diclegis as previously prescribed Discussed hyperemesis diet Patient advised to follow-up with [facility] as scheduled or sooner if symptoms worsen Patient may return to MAU as needed or if her condition were to change or worsen  Marny Lowenstein, PA-C  12/10/2013, 12:39 AM

## 2013-12-10 DIAGNOSIS — O210 Mild hyperemesis gravidarum: Secondary | ICD-10-CM

## 2013-12-10 NOTE — Discharge Instructions (Signed)
Morning Sickness °Morning sickness is when you feel sick to your stomach (nauseous) during pregnancy. You may feel sick to your stomach and throw up (vomit). You may feel sick in the morning, but you can feel this way any time of day. Some women feel very sick to their stomach and cannot stop throwing up (hyperemesis gravidarum). °HOME CARE °· Only take medicines as told by your doctor. °· Take multivitamins as told by your doctor. Taking multivitamins before getting pregnant can stop or lessen the harshness of morning sickness. °· Eat dry toast or unsalted crackers before getting out of bed. °· Eat 5 to 6 small meals a day. °· Eat dry and bland foods like rice and baked potatoes. °· Do not drink liquids with meals. Drink between meals. °· Do not eat greasy, fatty, or spicy foods. °· Have someone cook for you if the smell of food causes you to feel sick or throw up. °· If you feel sick to your stomach after taking prenatal vitamins, take them at night or with a snack. °· Eat protein when you need a snack (nuts, yogurt, cheese). °· Eat unsweetened gelatins for dessert. °· Wear a bracelet used for sea sickness (acupressure wristband). °· Go to a doctor that puts thin needles into certain body points (acupuncture) to improve how you feel. °· Do not smoke. °· Use a humidifier to keep the air in your house free of odors. °· Get lots of fresh air. °GET HELP IF: °· You need medicine to feel better. °· You feel dizzy or lightheaded. °· You are losing weight. °GET HELP RIGHT AWAY IF:  °· You feel very sick to your stomach and cannot stop throwing up. °· You pass out (faint). °MAKE SURE YOU: °· Understand these instructions. °· Will watch your condition. °· Will get help right away if you are not doing well or get worse. °Document Released: 04/12/2004 Document Revised: 03/10/2013 Document Reviewed: 08/20/2012 °ExitCare® Patient Information ©2015 [employer]. This information is not intended to replace advice given to you by  your health care provider. Make sure you discuss any questions you have with your health care provider. ° °Eating Plan for Hyperemesis Gravidarum °Severe cases of hyperemesis gravidarum can lead to dehydration and malnutrition. The hyperemesis eating plan is one way to lessen the symptoms of nausea and vomiting. It is often used with prescribed medicines to control your symptoms.  °WHAT CAN I DO TO RELIEVE MY SYMPTOMS? °Listen to your body. Everyone is different and has different preferences. Find what works best for you. Some of the following things may help: °· Eat and drink slowly. °· Eat 5-6 small meals daily instead of 3 large meals.   °· Eat crackers before you get out of bed in the morning.   °· Starchy foods are usually well tolerated (such as cereal, toast, bread, potatoes, pasta, rice, and pretzels).   °· Ginger may help with nausea. Add ¼ tsp ground ginger to hot tea or choose ginger tea.   °· Try drinking 100% fruit juice or an electrolyte drink. °· Continue to take your prenatal vitamins as directed by your health care provider. If you are having trouble taking your prenatal vitamins, talk with your health care provider about different options. °· Include at least 1 serving of protein with your meals and snacks (such as meats or poultry, beans, nuts, eggs, or yogurt). Try eating a protein-rich snack before bed (such as cheese and crackers or a half turkey or peanut butter sandwich). °WHAT THINGS SHOULD I   AVOID TO REDUCE MY SYMPTOMS? °The following things may help reduce your symptoms: °· Avoid foods with strong smells. Try eating meals in well-ventilated areas that are free of odors. °· Avoid drinking water or other beverages with meals. Try not to drink anything less than 30 minutes before and after meals. °· Avoid drinking more than 1 cup of fluid at a time. °· Avoid fried or high-fat foods, such as butter and cream sauces. °· Avoid spicy foods. °· Avoid skipping meals the best you can. Nausea can be  more intense on an empty stomach. If you cannot tolerate food at that time, do not force it. Try sucking on ice chips or other frozen items and make up the calories later. °· Avoid lying down within 2 hours after eating. °Document Released: 12/31/2006 Document Revised: 03/10/2013 Document Reviewed: 01/07/2013 °ExitCare® Patient Information ©2015 [employer]. This information is not intended to replace advice given to you by your health care provider. Make sure you discuss any questions you have with your health care provider. ° °

## 2013-12-14 ENCOUNTER — Encounter (HOSPITAL_COMMUNITY): Payer: Self-pay | Admitting: *Deleted

## 2013-12-14 ENCOUNTER — Inpatient Hospital Stay (HOSPITAL_COMMUNITY)
Admission: AD | Admit: 2013-12-14 | Discharge: 2013-12-14 | Disposition: A | Discharge status: Home / Self Care | Payer: Medicaid Other | Source: Ambulatory Visit | Attending: Obstetrics and Gynecology | Admitting: Obstetrics and Gynecology

## 2013-12-14 DIAGNOSIS — O2390 Unspecified genitourinary tract infection in pregnancy, unspecified trimester: Secondary | ICD-10-CM | POA: Insufficient documentation

## 2013-12-14 DIAGNOSIS — B373 Candidiasis of vulva and vagina: Secondary | ICD-10-CM | POA: Diagnosis not present

## 2013-12-14 DIAGNOSIS — Z87891 Personal history of nicotine dependence: Secondary | ICD-10-CM | POA: Diagnosis not present

## 2013-12-14 DIAGNOSIS — O210 Mild hyperemesis gravidarum: Secondary | ICD-10-CM | POA: Diagnosis not present

## 2013-12-14 DIAGNOSIS — O219 Vomiting of pregnancy, unspecified: Secondary | ICD-10-CM

## 2013-12-14 DIAGNOSIS — B3731 Acute candidiasis of vulva and vagina: Secondary | ICD-10-CM | POA: Diagnosis not present

## 2013-12-14 LAB — URINALYSIS, ROUTINE W REFLEX MICROSCOPIC
BILIRUBIN URINE: NEGATIVE
Bilirubin Urine: NEGATIVE
Glucose, UA: NEGATIVE mg/dL
Glucose, UA: NEGATIVE mg/dL
HGB URINE DIPSTICK: NEGATIVE
Hgb urine dipstick: NEGATIVE
KETONES UR: 15 mg/dL — AB
Ketones, ur: 15 mg/dL — AB
NITRITE: NEGATIVE
Nitrite: NEGATIVE
PH: 6 (ref 5.0–8.0)
PROTEIN: NEGATIVE mg/dL
Protein, ur: NEGATIVE mg/dL
SPECIFIC GRAVITY, URINE: 1.025 (ref 1.005–1.030)
Specific Gravity, Urine: 1.015 (ref 1.005–1.030)
Urobilinogen, UA: 0.2 mg/dL (ref 0.0–1.0)
Urobilinogen, UA: 0.2 mg/dL (ref 0.0–1.0)
pH: 7.5 (ref 5.0–8.0)

## 2013-12-14 LAB — URINE MICROSCOPIC-ADD ON

## 2013-12-14 MED ORDER — PROMETHAZINE HCL 25 MG/ML IJ SOLN
25.0000 mg | Freq: Once | INTRAVENOUS | Status: AC
  Administered 2013-12-14: 25 mg via INTRAVENOUS
  Filled 2013-12-14: qty 1

## 2013-12-14 MED ORDER — TERCONAZOLE 0.4 % VA CREA
1.0000 | TOPICAL_CREAM | Freq: Every day | VAGINAL | Status: DC
Start: 2013-12-14 — End: 2014-07-27

## 2013-12-14 NOTE — MAU Provider Note (Signed)
History     CSN: [id number]  Arrival date and time: 12/14/13 1722   First Provider Initiated Contact with Patient 12/14/13 1907      Chief Complaint  Patient presents with  . Emesis During Pregnancy   HPI  Ms.Dominique Schaefer is a 23 y.o. female G3P1011 at [redacted]w[redacted]d who presents with nausea and vomiting. She has vomited 7 times today. She is currently using reglan at home and feels that it helps only minimally. She was taking declegis at one point, however stopped because it made her drowsy.   OB History   Grav Para Term Preterm Abortions TAB SAB Ect Mult Living   0 1 1 0 0 0 1      Past Medical History  Diagnosis Date  . Pregnancy complication     Increased risk of Down Syn with Quad screen.  Marland Kitchen PONV (postoperative nausea and vomiting)   . Peripheral vascular disease     left ankle - blood clot per pt- placed on aspirin  x 1 wk.  . Anemia     hx - no meds  . Headache(784.0)     otc meds prn  . Arthritis     bilateral ankles  . Anxiety   . Depression     Past Surgical History  Procedure Laterality Date  . Ankle reconstruction      bilateral ankle surgeries from athletic injuries  . Mouth surgery      palate  . Svd      x 1  . Laparoscopy  04/16/2011    Procedure: LAPAROSCOPY OPERATIVE;  Surgeon: Fortino Sic, MD;  Location: [facility];  Service: Gynecology;  Laterality: N/A;  Removal of Intra Uterine Device   . Iud removal  04/16/2011    Procedure: INTRAUTERINE DEVICE (IUD) REMOVAL;  Surgeon: Fortino Sic, MD;  Location: [facility];  Service: Gynecology;  Laterality: N/A;  marina placement under ultrasound guidance    Family History  Problem Relation Age of Onset  . Anesthesia problems Paternal Uncle     has had recusitation    History  Substance Use Topics  . Smoking status: Former Smoker -- 0.25 packs/day for 4 years    Types: Cigarettes    Quit date: 05/26/2010  . Smokeless tobacco: Never Used  . Alcohol Use: No    Allergies:  Allergies  Allergen  Reactions  . Other Anaphylaxis    States is allergic to Bees    Prescriptions prior to admission  Medication Sig Dispense Refill  . acetaminophen (TYLENOL) 500 MG tablet Take 500 mg by mouth every 6 (six) hours as needed for headache.      . metoCLOPramide (REGLAN) 10 MG tablet Take 10 mg by mouth every 8 (eight) hours.       . Prenatal Vit-Fe Fumarate-FA (PRENATAL MULTIVITAMIN) TABS tablet Take 1 tablet by mouth daily at 12 noon.       Results for orders placed during the hospital encounter of 12/14/13 (from the past 48 hour(s))  URINALYSIS, ROUTINE W REFLEX MICROSCOPIC     Status: Abnormal   Collection Time    12/14/13  5:45 PM      Result Value Ref Range   Color, Urine YELLOW  YELLOW   APPearance HAZY (*) CLEAR   Specific Gravity, Urine 1.015  1.005 - 1.030   pH 7.5  5.0 - 8.0   Glucose, UA NEGATIVE  NEGATIVE mg/dL   Hgb urine dipstick NEGATIVE  NEGATIVE   Bilirubin Urine  NEGATIVE  NEGATIVE   Ketones, ur 15 (*) NEGATIVE mg/dL   Protein, ur NEGATIVE  NEGATIVE mg/dL   Urobilinogen, UA 0.2  0.0 - 1.0 mg/dL   Nitrite NEGATIVE  NEGATIVE   Leukocytes, UA LARGE (*) NEGATIVE  URINE MICROSCOPIC-ADD ON     Status: Abnormal   Collection Time    12/14/13  5:45 PM      Result Value Ref Range   Squamous Epithelial / LPF MANY (*) RARE   WBC, UA 11-20  <3 WBC/hpf   RBC / HPF 3-6  <3 RBC/hpf   Bacteria, UA MANY (*) RARE   Urine-Other MUCOUS PRESENT     Comment: FEW YEAST     AMORPHOUS URATES/PHOSPHATES    Review of Systems  Constitutional: Negative for fever and chills.  Gastrointestinal: Positive for nausea and vomiting. Negative for heartburn, abdominal pain, diarrhea and constipation.  Genitourinary: Negative for dysuria, urgency and frequency.       Denies vaginal bleeding or vaginal bleeding   Musculoskeletal: Positive for back pain (cramping in hips and back ).   Physical Exam   Blood pressure 116/60, pulse 70, temperature 98.7 F (37.1 C), temperature source Oral, resp.  rate 18, height  (1.549 m), weight 83.915 kg (185 lb), currently breastfeeding.  Physical Exam  Constitutional: She is oriented to person, place, and time. She appears well-developed and well-nourished.  Non-toxic appearance. She does not have a sickly appearance. She does not appear ill. No distress.  HENT:  Head: Normocephalic.  Eyes: Conjunctivae are normal.  Neck: Neck supple.  Respiratory: Effort normal.  Musculoskeletal: Normal range of motion.  Neurological: She is alert and oriented to person, place, and time.  Skin: Skin is warm. She is not diaphoretic.  Psychiatric: Her behavior is normal.    MAU Course  Procedures None  MDM D5LR bolus with phenergan Discussed patient and labs with Dr. Jackelyn Knife I have asked the patient to recollect a UA sample for a clean catch. First specimen is suspicious for a UTI, however many Squamous cells noted  Report given to Sharen Counter who resumes care of the patient   Assessment and Plan   A: Yeast vaginitis Nausea and vomiting in pregnancy  P: RX: terazol  Diclegis tablet at night time only Continue reglan during the day.   Iona Hansen Rasch, NP 12/14/2013 8:05 PM   Recollection of urine not suspicious for UTI except trace leukocytes.  Pt denies abdominal pain or urinary symptoms.    D/C home with plan listed above. Pt has appt in office in 2 days.  Sharen Counter Certified Nurse-Midwife

## 2013-12-14 NOTE — MAU Note (Signed)
Can't keep anything down.  Was here a wk ago,started on reglan, was doing better, now has gotten worse, thrown up several times today.

## 2013-12-14 NOTE — Discharge Instructions (Signed)

## 2013-12-16 LAB — OB RESULTS CONSOLE RPR
RPR: NONREACTIVE
RPR: NONREACTIVE

## 2013-12-16 LAB — OB RESULTS CONSOLE ABO/RH: "RH Type ": POSITIVE

## 2013-12-16 LAB — OB RESULTS CONSOLE RUBELLA ANTIBODY, IGM: RUBELLA: IMMUNE

## 2013-12-16 LAB — OB RESULTS CONSOLE HEPATITIS B SURFACE ANTIGEN: Hepatitis B Surface Ag: NEGATIVE

## 2013-12-16 LAB — OB RESULTS CONSOLE ANTIBODY SCREEN: ANTIBODY SCREEN: NEGATIVE

## 2013-12-16 LAB — OB RESULTS CONSOLE GC/CHLAMYDIA
Chlamydia: NEGATIVE
Gonorrhea: NEGATIVE

## 2014-01-18 ENCOUNTER — Encounter (HOSPITAL_COMMUNITY): Payer: Self-pay | Admitting: *Deleted

## 2014-03-19 NOTE — L&D Delivery Note (Signed)
Delivery Note Pt progressed to complete and pushed well.  At 12:26 PM a viable female was delivered via Vaginal, Spontaneous Delivery (Presentation: Left Occiput Anterior).  APGAR: 9, 9; weight pending.   Placenta status: Intact, Spontaneous.  Cord: 3 vessels with the following complications: None.  Anesthesia: Epidural  Episiotomy: None Lacerations: 2nd degree Suture Repair: 3.0 vicryl rapide Est. Blood Loss (mL): 334  Mom to postpartum.  Baby to Couplet care / Skin to Skin.  Will check sugars initially, d/c if normal.  Piya Mesch D 07/26/2014, 12:48 PM

## 2014-05-04 LAB — OB RESULTS CONSOLE HIV ANTIBODY (ROUTINE TESTING): HIV: NONREACTIVE

## 2014-06-02 ENCOUNTER — Encounter: Payer: BLUE CROSS/BLUE SHIELD | Attending: Obstetrics and Gynecology

## 2014-06-02 VITALS — Ht 61.0 in | Wt 207.0 lb

## 2014-06-02 DIAGNOSIS — O24419 Gestational diabetes mellitus in pregnancy, unspecified control: Secondary | ICD-10-CM | POA: Diagnosis not present

## 2014-06-02 DIAGNOSIS — Z713 Dietary counseling and surveillance: Secondary | ICD-10-CM | POA: Diagnosis not present

## 2014-06-09 NOTE — Progress Notes (Signed)
  Patient was seen on 06/02/14 for Gestational Diabetes self-management . The following learning objectives were met by the patient :   States the definition of Gestational Diabetes  States why dietary management is important in controlling blood glucose  Describes the effects of carbohydrates on blood glucose levels  Demonstrates ability to create a balanced meal plan  Demonstrates carbohydrate counting   States when to check blood glucose levels  Demonstrates proper blood glucose monitoring techniques  States the effect of stress and exercise on blood glucose levels  States the importance of limiting caffeine and abstaining from alcohol and smoking  Plan:  Aim for 2 Carb Choices per meal (30 grams) +/- 1 either way for breakfast Aim for 3 Carb Choices per meal (45 grams) +/- 1 either way from lunch and dinner Aim for 1-2 Carbs per snack Begin reading food labels for Total Carbohydrate and sugar grams of foods Consider  increasing your activity level by walking daily as tolerated Begin checking BG before breakfast and 2 hours after first bit of breakfast, lunch and dinner after  as directed by MD  Take medication  as directed by MD  Blood glucose monitor given:Accu-Chek Nano Lot # [id number] Exp: 10/17/14 Blood glucose reading: 153  Patient instructed to monitor glucose levels: FBS: 60 - <90 2 hour: <120  Patient received the following handouts:  Nutrition Diabetes and Pregnancy  Carbohydrate Counting List  Meal Planning worksheet  Patient will be seen for follow-up as needed.

## 2014-06-11 ENCOUNTER — Inpatient Hospital Stay (HOSPITAL_COMMUNITY)
Admission: AD | Admit: 2014-06-11 | Discharge: 2014-06-11 | Disposition: A | Discharge status: Home / Self Care | Payer: BLUE CROSS/BLUE SHIELD | Source: Ambulatory Visit | Attending: Obstetrics and Gynecology | Admitting: Obstetrics and Gynecology

## 2014-06-11 DIAGNOSIS — O24419 Gestational diabetes mellitus in pregnancy, unspecified control: Secondary | ICD-10-CM | POA: Diagnosis present

## 2014-06-11 DIAGNOSIS — Z3A33 33 weeks gestation of pregnancy: Secondary | ICD-10-CM | POA: Insufficient documentation

## 2014-06-11 LAB — URINALYSIS, ROUTINE W REFLEX MICROSCOPIC
BILIRUBIN URINE: NEGATIVE
GLUCOSE, UA: NEGATIVE mg/dL
Hgb urine dipstick: NEGATIVE
KETONES UR: NEGATIVE mg/dL
Nitrite: NEGATIVE
PH: 6.5 (ref 5.0–8.0)
Protein, ur: NEGATIVE mg/dL
Specific Gravity, Urine: 1.015 (ref 1.005–1.030)
Urobilinogen, UA: 0.2 mg/dL (ref 0.0–1.0)

## 2014-06-11 LAB — URINE MICROSCOPIC-ADD ON

## 2014-06-11 NOTE — MAU Note (Signed)
Pt presents for a scheduled NST due to GDM. Denies pain or bleeding or leaking. Reports good fetal movement.

## 2014-06-25 LAB — OB RESULTS CONSOLE GBS: GBS: NEGATIVE

## 2014-07-26 ENCOUNTER — Inpatient Hospital Stay (HOSPITAL_COMMUNITY): Payer: BLUE CROSS/BLUE SHIELD | Admitting: Anesthesiology

## 2014-07-26 ENCOUNTER — Inpatient Hospital Stay (HOSPITAL_COMMUNITY)
Admission: RE | Admit: 2014-07-26 | Discharge: 2014-07-27 | DRG: 775 | Disposition: A | Discharge status: Home / Self Care | Payer: BLUE CROSS/BLUE SHIELD | Source: Ambulatory Visit | Attending: Obstetrics and Gynecology | Admitting: Obstetrics and Gynecology

## 2014-07-26 ENCOUNTER — Encounter (HOSPITAL_COMMUNITY): Payer: Self-pay

## 2014-07-26 DIAGNOSIS — Z3A39 39 weeks gestation of pregnancy: Secondary | ICD-10-CM | POA: Diagnosis present

## 2014-07-26 DIAGNOSIS — Z87891 Personal history of nicotine dependence: Secondary | ICD-10-CM | POA: Diagnosis not present

## 2014-07-26 DIAGNOSIS — O24429 Gestational diabetes mellitus in childbirth, unspecified control: Secondary | ICD-10-CM | POA: Diagnosis present

## 2014-07-26 DIAGNOSIS — Z79899 Other long term (current) drug therapy: Secondary | ICD-10-CM | POA: Diagnosis not present

## 2014-07-26 DIAGNOSIS — O99344 Other mental disorders complicating childbirth: Secondary | ICD-10-CM | POA: Diagnosis present

## 2014-07-26 DIAGNOSIS — F329 Major depressive disorder, single episode, unspecified: Secondary | ICD-10-CM | POA: Diagnosis present

## 2014-07-26 DIAGNOSIS — O24414 Gestational diabetes mellitus in pregnancy, insulin controlled: Secondary | ICD-10-CM

## 2014-07-26 DIAGNOSIS — F419 Anxiety disorder, unspecified: Secondary | ICD-10-CM | POA: Diagnosis present

## 2014-07-26 LAB — RPR: RPR Ser Ql: NONREACTIVE

## 2014-07-26 LAB — TYPE AND SCREEN
ABO/RH(D): A POS
Antibody Screen: NEGATIVE

## 2014-07-26 LAB — CBC
HEMATOCRIT: 34.2 % — AB (ref 36.0–46.0)
Hemoglobin: 11.6 g/dL — ABNORMAL LOW (ref 12.0–15.0)
MCH: 27.8 pg (ref 26.0–34.0)
MCHC: 33.9 g/dL (ref 30.0–36.0)
MCV: 82 fL (ref 78.0–100.0)
Platelets: 233 10*3/uL (ref 150–400)
RBC: 4.17 MIL/uL (ref 3.87–5.11)
RDW: 15.3 % (ref 11.5–15.5)
WBC: 10.5 10*3/uL (ref 4.0–10.5)

## 2014-07-26 LAB — GLUCOSE, CAPILLARY: Glucose-Capillary: 144 mg/dL — ABNORMAL HIGH (ref 70–99)

## 2014-07-26 LAB — ABO/RH: ABO/RH(D): A POS

## 2014-07-26 MED ORDER — DIBUCAINE 1 % RE OINT: 1.0000 "application " | TOPICAL_OINTMENT | RECTAL | Status: DC | PRN

## 2014-07-26 MED ORDER — BENZOCAINE-MENTHOL 20-0.5 % EX AERO
1.0000 "application " | INHALATION_SPRAY | CUTANEOUS | Status: DC | PRN
  Administered 2014-07-26: 1 via TOPICAL
  Filled 2014-07-26: qty 56

## 2014-07-26 MED ORDER — SIMETHICONE 80 MG PO CHEW: 80.0000 mg | CHEWABLE_TABLET | ORAL | Status: DC | PRN

## 2014-07-26 MED ORDER — ACETAMINOPHEN 325 MG PO TABS: 650.0000 mg | ORAL_TABLET | ORAL | Status: DC | PRN

## 2014-07-26 MED ORDER — DIPHENHYDRAMINE HCL 25 MG PO CAPS: 25.0000 mg | ORAL_CAPSULE | Freq: Four times a day (QID) | ORAL | Status: DC | PRN

## 2014-07-26 MED ORDER — ONDANSETRON HCL 4 MG PO TABS: 4.0000 mg | ORAL_TABLET | ORAL | Status: DC | PRN

## 2014-07-26 MED ORDER — DIPHENHYDRAMINE HCL 50 MG/ML IJ SOLN: 12.5000 mg | INTRAMUSCULAR | Status: DC | PRN

## 2014-07-26 MED ORDER — PHENYLEPHRINE 40 MCG/ML (10ML) SYRINGE FOR IV PUSH (FOR BLOOD PRESSURE SUPPORT)
80.0000 ug | PREFILLED_SYRINGE | INTRAVENOUS | Status: DC | PRN
  Filled 2014-07-26: qty 20
  Filled 2014-07-26: qty 2

## 2014-07-26 MED ORDER — LANOLIN HYDROUS EX OINT: TOPICAL_OINTMENT | CUTANEOUS | Status: DC | PRN

## 2014-07-26 MED ORDER — OXYCODONE-ACETAMINOPHEN 5-325 MG PO TABS: 1.0000 | ORAL_TABLET | ORAL | Status: DC | PRN

## 2014-07-26 MED ORDER — METHYLERGONOVINE MALEATE 0.2 MG/ML IJ SOLN: 0.2000 mg | INTRAMUSCULAR | Status: DC | PRN

## 2014-07-26 MED ORDER — FENTANYL 2.5 MCG/ML BUPIVACAINE 1/10 % EPIDURAL INFUSION (WH - ANES)
14.0000 mL/h | INTRAMUSCULAR | Status: DC | PRN
  Administered 2014-07-26: 12.5 mL/h via EPIDURAL
  Filled 2014-07-26: qty 125

## 2014-07-26 MED ORDER — LIDOCAINE HCL (PF) 1 % IJ SOLN
30.0000 mL | INTRAMUSCULAR | Status: DC | PRN
  Filled 2014-07-26: qty 30

## 2014-07-26 MED ORDER — OXYTOCIN 40 UNITS IN LACTATED RINGERS INFUSION - SIMPLE MED
1.0000 m[IU]/min | INTRAVENOUS | Status: DC
  Administered 2014-07-26: 2 m[IU]/min via INTRAVENOUS
  Filled 2014-07-26: qty 1000

## 2014-07-26 MED ORDER — LIDOCAINE HCL (PF) 1 % IJ SOLN
INTRAMUSCULAR | Status: DC | PRN
  Administered 2014-07-26: 4 mL
  Administered 2014-07-26: 3 mL

## 2014-07-26 MED ORDER — PRENATAL MULTIVITAMIN CH: 1.0000 | ORAL_TABLET | Freq: Every day | ORAL | Status: DC

## 2014-07-26 MED ORDER — TETANUS-DIPHTH-ACELL PERTUSSIS 5-2.5-18.5 LF-MCG/0.5 IM SUSP: 0.5000 mL | Freq: Once | INTRAMUSCULAR | Status: DC

## 2014-07-26 MED ORDER — LACTATED RINGERS IV SOLN
INTRAVENOUS | Status: DC
  Administered 2014-07-26 (×2): via INTRAVENOUS

## 2014-07-26 MED ORDER — OXYCODONE-ACETAMINOPHEN 5-325 MG PO TABS: 2.0000 | ORAL_TABLET | ORAL | Status: DC | PRN

## 2014-07-26 MED ORDER — TERBUTALINE SULFATE 1 MG/ML IJ SOLN
0.2500 mg | Freq: Once | INTRAMUSCULAR | Status: DC | PRN
  Filled 2014-07-26: qty 1

## 2014-07-26 MED ORDER — WITCH HAZEL-GLYCERIN EX PADS: 1.0000 "application " | MEDICATED_PAD | CUTANEOUS | Status: DC | PRN

## 2014-07-26 MED ORDER — OXYTOCIN BOLUS FROM INFUSION: 500.0000 mL | INTRAVENOUS | Status: DC

## 2014-07-26 MED ORDER — CITRIC ACID-SODIUM CITRATE 334-500 MG/5ML PO SOLN: 30.0000 mL | ORAL | Status: DC | PRN

## 2014-07-26 MED ORDER — OXYCODONE-ACETAMINOPHEN 5-325 MG PO TABS
1.0000 | ORAL_TABLET | ORAL | Status: DC | PRN
  Administered 2014-07-26 – 2014-07-27 (×2): 1 via ORAL
  Filled 2014-07-26 (×2): qty 1

## 2014-07-26 MED ORDER — SENNOSIDES-DOCUSATE SODIUM 8.6-50 MG PO TABS
2.0000 | ORAL_TABLET | ORAL | Status: DC
  Administered 2014-07-26: 2 via ORAL
  Filled 2014-07-26: qty 2

## 2014-07-26 MED ORDER — MEASLES, MUMPS & RUBELLA VAC ~~LOC~~ INJ
0.5000 mL | INJECTION | Freq: Once | SUBCUTANEOUS | Status: DC
  Filled 2014-07-26: qty 0.5

## 2014-07-26 MED ORDER — BUTORPHANOL TARTRATE 1 MG/ML IJ SOLN: 1.0000 mg | INTRAMUSCULAR | Status: DC | PRN

## 2014-07-26 MED ORDER — OXYTOCIN 40 UNITS IN LACTATED RINGERS INFUSION - SIMPLE MED
62.5000 mL/h | INTRAVENOUS | Status: DC
Start: 2014-07-26 — End: 2014-07-26
  Administered 2014-07-26: 62.5 mL/h via INTRAVENOUS

## 2014-07-26 MED ORDER — EPHEDRINE 5 MG/ML INJ
10.0000 mg | INTRAVENOUS | Status: DC | PRN
  Filled 2014-07-26: qty 2

## 2014-07-26 MED ORDER — MAGNESIUM HYDROXIDE 400 MG/5ML PO SUSP: 30.0000 mL | ORAL | Status: DC | PRN

## 2014-07-26 MED ORDER — IBUPROFEN 600 MG PO TABS
600.0000 mg | ORAL_TABLET | Freq: Four times a day (QID) | ORAL | Status: DC
  Administered 2014-07-26 – 2014-07-27 (×3): 600 mg via ORAL
  Filled 2014-07-26 (×3): qty 1

## 2014-07-26 MED ORDER — ZOLPIDEM TARTRATE 5 MG PO TABS: 5.0000 mg | ORAL_TABLET | Freq: Every evening | ORAL | Status: DC | PRN

## 2014-07-26 MED ORDER — ONDANSETRON HCL 4 MG/2ML IJ SOLN: 4.0000 mg | Freq: Four times a day (QID) | INTRAMUSCULAR | Status: DC | PRN

## 2014-07-26 MED ORDER — ONDANSETRON HCL 4 MG/2ML IJ SOLN: 4.0000 mg | INTRAMUSCULAR | Status: DC | PRN

## 2014-07-26 MED ORDER — LACTATED RINGERS IV SOLN
500.0000 mL | INTRAVENOUS | Status: DC | PRN
  Administered 2014-07-26: 1000 mL via INTRAVENOUS

## 2014-07-26 MED ORDER — METHYLERGONOVINE MALEATE 0.2 MG PO TABS: 0.2000 mg | ORAL_TABLET | ORAL | Status: DC | PRN

## 2014-07-26 NOTE — Anesthesia Preprocedure Evaluation (Signed)
Anesthesia Evaluation  Patient identified by MRN, date of birth, ID band Patient awake    Reviewed: Allergy & Precautions, NPO status , Patient's Chart, lab work & pertinent test results  History of Anesthesia Complications (+) PONV and history of anesthetic complications  Airway Mallampati: III  TM Distance: >3 FB Neck ROM: Full    Dental no notable dental hx. (+) Teeth Intact   Pulmonary former smoker,  breath sounds clear to auscultation  Pulmonary exam normal       Cardiovascular + Peripheral Vascular Disease negative cardio ROS Normal cardiovascular examRhythm:Regular Rate:Normal     Neuro/Psych  Headaches, PSYCHIATRIC DISORDERS Anxiety Depression    GI/Hepatic Neg liver ROS, GERD-  Medicated and Controlled,  Endo/Other  diabetes, Well Controlled, GestationalMorbid obesity  Renal/GU negative Renal ROS     Musculoskeletal  (+) Arthritis -, Osteoarthritis,    Abdominal (+) + obese,   Peds  Hematology  (+) anemia ,   Anesthesia Other Findings   Reproductive/Obstetrics (+) Pregnancy                             Anesthesia Physical Anesthesia Plan  ASA: III  Anesthesia Plan: Epidural   Post-op Pain Management:    Induction:   Airway Management Planned: Natural Airway  Additional Equipment:   Intra-op Plan:   Post-operative Plan:   Informed Consent: I have reviewed the patients History and Physical, chart, labs and discussed the procedure including the risks, benefits and alternatives for the proposed anesthesia with the patient or authorized representative who has indicated his/her understanding and acceptance.     Plan Discussed with: Anesthesiologist  Anesthesia Plan Comments:         Anesthesia Quick Evaluation

## 2014-07-26 NOTE — Lactation Note (Signed)
This note was copied from the chart of Dominique Schaefer Ruane. Lactation Consultation Note  Patient Name: Dominique Schaefer Nees ZOXWR'UToday's Date: 07/26/2014 Reason for consult: Initial assessment Baby 7 hours of life. Mom reports that she has nursed X3, for 20-25 minutes at each feed. Mom states that she is having a little harder time latching baby to right breast. Mom reports that this was the case with her first child as well. Mom states that baby pulls herself off the breast when she is finished, and she seems satisfied until next feeding. Mom states that she is able to hand express colostrum from both breast. Mom reports that baby is latching fine without any NS, but she is using shells. Enc mom to call for assistance with latching as needed. Mom has a room full of family at this time. [facility] given [facility] broch[facility], aware of OP/BFSG, community resources, and [facility] phone [facility] assistance after D/C.  Maternal Data Has patient been taught Hand Expression?: Yes Does the patient have breastfeeding experience prior to this delivery?: Yes  Feeding Feeding Type: Breast Fed  LATCH Score/Interventions                      Lactation Tools Discussed/Used     Consult Status Consult Status: Follow-up Date: 07/27/14 Follow-up type: In-patient    Geralynn OchsWILLIARD, Joab Carden 07/26/2014, 8:17 PM

## 2014-07-26 NOTE — Anesthesia Postprocedure Evaluation (Signed)
Anesthesia Post Note  Patient: Dominique Schaefer  Procedure(s) Performed: * No procedures listed *  Anesthesia type: Epidural  Patient location: Mother/Baby  Post pain: Pain level controlled  Post assessment: Post-op Vital signs reviewed  Last Vitals:  Filed Vitals:   07/26/14 1830  BP: 128/74  Pulse: 70  Temp: 36.8 C  Resp: 20    Post vital signs: Reviewed  Level of consciousness:alert  Complications: No apparent anesthesia complications

## 2014-07-26 NOTE — Anesthesia Procedure Notes (Signed)
Epidural Patient location during procedure: OB Start time: 07/26/2014 10:39 AM  Staffing Anesthesiologist: Mal AmabileFOSTER, Marsha Gundlach Performed by: anesthesiologist   Preanesthetic Checklist Completed: patient identified, site marked, surgical consent, pre-op evaluation, timeout performed, IV checked, risks and benefits discussed and monitors and equipment checked  Epidural Patient position: sitting Prep: site prepped and draped and DuraPrep Patient monitoring: continuous pulse ox and blood pressure Approach: midline Location: L4-L5 Injection technique: LOR air  Needle:  Needle type: Tuohy  Needle gauge: 17 G Needle length: 9 cm and 9 Needle insertion depth: 5 cm cm Catheter type: closed end flexible Catheter size: 19 Gauge Catheter at skin depth: 10 cm Test dose: negative and Other  Assessment Events: blood not aspirated, injection not painful, no injection resistance, negative IV test and no paresthesia  Additional Notes Patient identified. Risks and benefits discussed including failed block, incomplete  Pain control, post dural puncture headache, nerve damage, paralysis, blood pressure Changes, nausea, vomiting, reactions to medications-both toxic and allergic and post Partum back pain. All questions were answered. Patient expressed understanding and wished to proceed. Sterile technique was used throughout procedure. Epidural site was Dressed with sterile barrier dressing. No paresthesias, signs of intravascular injection Or signs of intrathecal spread were encountered.  Patient was more comfortable after the epidural was dosed. Please see RN's note for documentation of vital signs and FHR which are stable.

## 2014-07-26 NOTE — H&P (Signed)
Dominique Schaefer is a 24 y.o. female, G3 P1011, EGA 39+ weeks with EDC 5-12 presenting for elective induction with favorable cervix and A2 GDM.  Prenatal care complicated by GDM controlled with Glyburide, no other problems, see prenatal records for complete history.  Maternal Medical History:  Fetal activity: Perceived fetal activity is normal.    Prenatal Complications - Diabetes: gestational. Diabetes is managed by oral agent (monotherapy).      OB History    Gravida Para Term Preterm AB TAB SAB Ectopic Multiple Living   3 1 1  0 1 1 0 0 0 1     Past Medical History  Diagnosis Date  . Pregnancy complication     Increased risk of Down Syn with Quad screen.  Marland Kitchen. PONV (postoperative nausea and vomiting)   . Peripheral vascular disease     left ankle - blood clot per pt- placed on aspirin  x 1 wk.  . Anemia     hx - no meds  . Headache(784.0)     otc meds prn  . Arthritis     bilateral ankles  . Anxiety   . Depression   . Gestational diabetes    Past Surgical History  Procedure Laterality Date  . Ankle reconstruction      bilateral ankle surgeries from athletic injuries  . Mouth surgery      palate  . Svd      x 1  . Laparoscopy  04/16/2011    Procedure: LAPAROSCOPY OPERATIVE;  Surgeon: Fortino SicEleanor E Greene, MD;  Location: [facility];  Service: Gynecology;  Laterality: N/A;  Removal of Intra Uterine Device   . Iud removal  04/16/2011    Procedure: INTRAUTERINE DEVICE (IUD) REMOVAL;  Surgeon: Fortino SicEleanor E Greene, MD;  Location: [facility];  Service: Gynecology;  Laterality: N/A;  marina placement under ultrasound guidance   Family History: family history includes Anesthesia problems in her paternal uncle. Social History:  reports that she quit smoking about 4 years ago. Her smoking use included Cigarettes. She has a 1 pack-year smoking history. She has never used smokeless tobacco. She reports that she does not drink alcohol or use illicit drugs.   Prenatal Transfer Tool  Maternal Diabetes:  Yes:  Diabetes Type:  Insulin/Medication controlled Genetic Screening: Normal Maternal Ultrasounds/Referrals: Normal Fetal Ultrasounds or other Referrals:  None Maternal Substance Abuse:  No Significant Maternal Medications:  Meds include: Other:  Significant Maternal Lab Results:  Lab values include: Group B Strep negative Other Comments:  GDM controlled with Glyburide  Review of Systems  Respiratory: Negative.   Cardiovascular: Negative.    AROM- clear Dilation: 3 Effacement (%): 50 Station: -2 Exam by:: Jaimeson Gopal Blood pressure 128/84, pulse 83, temperature 98.7 F (37.1 C), height 5' (1.524 m), weight 95.709 kg (211 lb), currently breastfeeding. Maternal Exam:  Uterine Assessment: Contraction strength is mild.  Contraction frequency is irregular.   Abdomen: Patient reports no abdominal tenderness. Estimated fetal weight is 8 lbs.   Fetal presentation: vertex  Introitus: Normal vulva. Normal vagina.  Amniotic fluid character: clear.  Pelvis: adequate for delivery.   Cervix: Cervix evaluated by digital exam.     Fetal Exam Fetal Monitor Review: Mode: ultrasound.   Baseline rate: 130.  Variability: moderate (6-25 bpm).   Pattern: accelerations present and no decelerations.    Fetal State Assessment: Category I - tracings are normal.     Physical Exam  Vitals reviewed. Constitutional: She appears well-developed and well-nourished.  Cardiovascular: Normal rate, regular rhythm and normal  heart sounds.   No murmur heard. Respiratory: Effort normal and breath sounds normal. No respiratory distress. She has no wheezes.  GI: Soft.    Prenatal labs: ABO, Rh:  A pos Antibody:  neg Rubella:  Immune RPR:   NR HBsAg:   Neg HIV:   NR GBS:   Neg  Assessment/Plan: IUP at 39+ weeks with favorable cervix, GDM controlled with Glyburide.  Will start pitocin, AROM done, monitor progress, monitor sugars to see if needs treatment in labor.   Monick Rena D 07/26/2014, 8:37  AM

## 2014-07-27 LAB — GLUCOSE, CAPILLARY
GLUCOSE-CAPILLARY: 95 mg/dL (ref 70–99)
Glucose-Capillary: 148 mg/dL — ABNORMAL HIGH (ref 70–99)
Glucose-Capillary: 132 mg/dL — ABNORMAL HIGH (ref 70–99)

## 2014-07-27 MED ORDER — OXYCODONE-ACETAMINOPHEN 5-325 MG PO TABS: 1.0000 | ORAL_TABLET | ORAL | Status: AC | PRN

## 2014-07-27 MED ORDER — IBUPROFEN 600 MG PO TABS: 600.0000 mg | ORAL_TABLET | Freq: Four times a day (QID) | ORAL | Status: AC

## 2014-07-27 NOTE — Progress Notes (Signed)
CLINICAL SOCIAL WORK MATERNAL/CHILD NOTE  Patient Details  Name: Dominique Schaefer MRN: [id number] Date of Birth: 07/26/2016  Date:  07/27/2014  Clinical Social Worker Initiating Note:  Lucita Ferrara, LCSW Date/ Time Initiated:  07/27/14/1000     Legal Guardian:  Mickel Baas and Idali Lafever (parents)  Need for Interpreter:  None   Date of Referral:  07/26/14     Reason for Referral:  Behavioral Health Issues: MOB presents with history of anxiety, depression, and bipolar    Referral Source:  [facility]   Address:  [address] [place], [place] [address]  Phone number:  [phone]   Household Members:  Daughter (01/30/2011), [name]'s son (76 years old), Spouse/[name]   Natural Supports (not living in the home):  Immediate Family, Extended Family   Professional Supports: 1) MOB previously participated in therapy and medication management at [facility] and [facility]. She discontinued treatment when her insurance benefits changed.  Employment: Homemaker   Type of Work: N/A  Education:    N/A  Nutritional therapist, Medicaid   Other Resources:    [name] is employed.  Cultural/Religious Considerations Which May Impact Care:  None reproted  Strengths:  Ability to meet basic needs , Home prepared for child , Pediatrician chosen    Risk Factors/Current Problems:  Mental Health Concerns    Cognitive State:  Able to Concentrate , Alert , Linear Thinking , Goal Oriented    Mood/Affect:  Interested , Bright , Calm , Comfortable    CSW Assessment:  MOB provided consent for her parents and [name] to remain in the room while CSW completed assessment.   CSW provided supportive listening and assisted the MOB to process her thoughts and feelings as she transitions to the postpartum period.  Per MOB, she is excited about the arrival of this infant, and shared that it was a much "easier" experience in comparison to her 49 year old daughter.  She stated  that she hopes to be discharged home today, and reported feeling comfortable and supported.  CSW continued to normalize and assist the MOB to process the normative range in emotions that accompany mother and being a stay at home mother. Upon request from family, CSW provided list of parenting support groups since MOB expressed interest in interacting with other mothers.  CSW specifically inquired about MOB's mental health history.  CSW noted that a CSW briefly met with MOB in 2012 after her first daughter was born, but a full assessment was not completed since MOB had reported stabilization of symptoms for "years".  MOB acknowledged that her chart has a diagnosis of depression, anxiety, and bipolar, and stated that there was an onset of symptoms after her first child was born.  She shared that she is not unsure if the diagnosis is accurate since there were numerous factors/events occuring when she received the diagnosis.  She shared belief that since she had a placement of her IUD and progesterone, she had an increase in hormones that may have caused her to have more significant mood swings. She also discussed how she and the [name] were living with his parents and that was less than ideal since it increased stress.  Comments also highlighted that the MOB was overwhelmed as a first time mother since she also had an infant that was difficult to soothe.  She shared that she was routinely screened for postpartum depression by both her OB and her pediatrician.  She stated that due to her "scores" she was  referred to [facility].  Per MOB, she was prescribed Buspar and Zoloft and participated in therapy. She shared that since she continued to experience mood swings, she started Lamictal.  MOB stated that she started to not enjoy the feeling of being medicated and discontinued the medication.  She shared that she was on a low dose of Zoloft prior to the pregnancy, but then discontinued with the +UPT.  MOB denied  mental health concerns since she has discontinued the medication.   MOB stated that she and the [name] have no concrete plans to re-start medications now that she is no longer pregnant. She stated that they will "wait and see", but they presented with insight of need to not wait "too long" if they note onset of symptoms again.  MOB aware that she is at an increased risk for developing postpartum depression due to her previous experiences, but her comments continue to highlight that she genuinely believes that her symptoms were correlated to her birth control and situational stressors.   MOB stated that she feels comfortable with her providers, and discussed that she and the [name] are aware of symptoms to look for.   Overall, MOB and [name] presented as aware of maternal mental health and motivated to address symptoms of needs that may arise. They denied additional questions, concerns, or needs at this time. Family agreed to contact CSW if specific needs arise.  CSW Plan/Description: 1)Information/Referral to [employer]-- perinatal mental health resources 2)Patient/Family Education-- postpartum depression 3)No Further Intervention Required/No Barriers to Discharge

## 2014-07-27 NOTE — Progress Notes (Signed)
PPD #1 No problems, wants to go home today Afeb, VSS CBG 140s Fundus firm, NT at U+1 Continue routine postpartum care, d/c home later today if baby ok to go

## 2014-07-27 NOTE — Discharge Instructions (Signed)
As per discharge pamphlet °

## 2014-07-27 NOTE — Lactation Note (Signed)
This note was copied from the chart of Dominique Virgina EvenerLaura Bounds. Lactation Consultation Note  Mother states baby recently bf for 30 min.  Baby is sleeping in [name] arms. Mother states she is able to switch back and forth and baby will latch on both breasts. Mother and baby have history of thrush. Discussed not using lanolin and thrush prevention. Reviewed engorgement care and pumping. Provided mother w/ comfort gels and a hand pump. Encouraged her to call if she needs further assistance.  Patient Name: Dominique Schaefer     Maternal Data    Feeding Feeding Type: Breast Fed Length of feed: 30 min  LATCH Score/Interventions                      Lactation Tools Discussed/Used     Consult Status      Dominique Schaefer, Dominique Schaefer Schaefer, 12:09 PM

## 2014-07-27 NOTE — Discharge Summary (Signed)
Obstetric Discharge Summary Reason for Admission: induction of labor Prenatal Procedures: NST Intrapartum Procedures: spontaneous vaginal delivery Postpartum Procedures: none Complications-Operative and Postpartum: 2nd degree perineal laceration HEMOGLOBIN  Date Value Ref Range Status  07/26/2014 11.6* 12.0 - 15.0 g/dL Final   HCT  Date Value Ref Range Status  07/26/2014 34.2* 36.0 - 46.0 % Final    Physical Exam:  General: alert Lochia: appropriate Uterine Fundus: firm   Discharge Diagnoses: Term Pregnancy-delivered and A2 GDM  Discharge Information: Date: 07/27/2014 Activity: pelvic rest Diet: routine Medications: Ibuprofen and Percocet Condition: stable Instructions: refer to practice specific booklet Discharge to: home Follow-up Information    Follow up with Lakin Rhine D, MD. Schedule an appointment as soon as possible for a visit in 6 weeks.   Specialty:  Obstetrics and Gynecology   Contact information:   [address], [address] [place] [id number] [phone]       Newborn Data: Live born female  Birth Weight: 8 lb 4.2 oz (3748 g) APGAR: 9, 9  Home with mother.  Petrice Beedy D 07/27/2014, 7:36 AM

## 2014-11-05 ENCOUNTER — Emergency Department (HOSPITAL_COMMUNITY)
Admission: EM | Admit: 2014-11-05 | Discharge: 2014-11-05 | Disposition: A | Discharge status: Home / Self Care | Payer: No Typology Code available for payment source | Attending: Emergency Medicine | Admitting: Emergency Medicine

## 2014-11-05 ENCOUNTER — Encounter (HOSPITAL_COMMUNITY): Payer: Self-pay | Admitting: *Deleted

## 2014-11-05 ENCOUNTER — Emergency Department (HOSPITAL_COMMUNITY): Payer: No Typology Code available for payment source

## 2014-11-05 DIAGNOSIS — D649 Anemia, unspecified: Secondary | ICD-10-CM | POA: Insufficient documentation

## 2014-11-05 DIAGNOSIS — S3991XA Unspecified injury of abdomen, initial encounter: Secondary | ICD-10-CM | POA: Insufficient documentation

## 2014-11-05 DIAGNOSIS — Z87891 Personal history of nicotine dependence: Secondary | ICD-10-CM | POA: Diagnosis not present

## 2014-11-05 DIAGNOSIS — F419 Anxiety disorder, unspecified: Secondary | ICD-10-CM | POA: Insufficient documentation

## 2014-11-05 DIAGNOSIS — Y92410 Unspecified street and highway as the place of occurrence of the external cause: Secondary | ICD-10-CM | POA: Insufficient documentation

## 2014-11-05 DIAGNOSIS — F329 Major depressive disorder, single episode, unspecified: Secondary | ICD-10-CM | POA: Diagnosis not present

## 2014-11-05 DIAGNOSIS — S6992XA Unspecified injury of left wrist, hand and finger(s), initial encounter: Secondary | ICD-10-CM | POA: Diagnosis present

## 2014-11-05 DIAGNOSIS — Z3202 Encounter for pregnancy test, result negative: Secondary | ICD-10-CM | POA: Diagnosis not present

## 2014-11-05 DIAGNOSIS — M1990 Unspecified osteoarthritis, unspecified site: Secondary | ICD-10-CM | POA: Insufficient documentation

## 2014-11-05 DIAGNOSIS — Y9389 Activity, other specified: Secondary | ICD-10-CM | POA: Insufficient documentation

## 2014-11-05 DIAGNOSIS — Z8632 Personal history of gestational diabetes: Secondary | ICD-10-CM | POA: Diagnosis not present

## 2014-11-05 DIAGNOSIS — Y998 Other external cause status: Secondary | ICD-10-CM | POA: Insufficient documentation

## 2014-11-05 DIAGNOSIS — S63602A Unspecified sprain of left thumb, initial encounter: Secondary | ICD-10-CM

## 2014-11-05 DIAGNOSIS — Z79899 Other long term (current) drug therapy: Secondary | ICD-10-CM | POA: Insufficient documentation

## 2014-11-05 LAB — URINALYSIS, ROUTINE W REFLEX MICROSCOPIC
Bilirubin Urine: NEGATIVE
Glucose, UA: NEGATIVE mg/dL
Hgb urine dipstick: NEGATIVE
KETONES UR: NEGATIVE mg/dL
Leukocytes, UA: NEGATIVE
Nitrite: NEGATIVE
PH: 5.5 (ref 5.0–8.0)
Protein, ur: NEGATIVE mg/dL
Specific Gravity, Urine: 1.016 (ref 1.005–1.030)
Urobilinogen, UA: 0.2 mg/dL (ref 0.0–1.0)

## 2014-11-05 LAB — PREGNANCY, URINE: Preg Test, Ur: NEGATIVE

## 2014-11-05 MED ORDER — ACETAMINOPHEN 325 MG PO TABS
975.0000 mg | ORAL_TABLET | Freq: Once | ORAL | Status: AC
  Administered 2014-11-05: 975 mg via ORAL
  Filled 2014-11-05: qty 3

## 2014-11-05 NOTE — Discharge Instructions (Signed)
Take acetaminophen (Tylenol) up to 975 mg (this is normally 3 over-the-counter pills) up to 3 times a day. Do not drink alcohol. Make sure your other medications do not contain acetaminophen (Read the labels!)  If he decides to take the Vicodin please pump and dump as we have discussed.  Do not hesitate to return to the emergency room for any new, worsening or concerning symptoms.  Please obtain primary care using resource guide below. Let them know that you were seen in the emergency room and that they will need to obtain records for further outpatient management.    Emergency Department Resource Guide 1) Find a Doctor and Pay Out of Pocket Although you won't have to find out who is covered by your insurance plan, it is a good idea to ask around and get recommendations. You will then need to call the office and see if the doctor you have chosen will accept you as a new patient and what types of options they offer for patients who are self-pay. Some doctors offer discounts or will set up payment plans for their patients who do not have insurance, but you will need to ask so you aren't surprised when you get to your appointment.  2) Contact Your Local Health Department Not all health departments have doctors that can see patients for sick visits, but many do, so it is worth a call to see if yours does. If you don't know where your local health department is, you can check in your phone book. The CDC also has a tool to help you locate your state's health department, and many state websites also have listings of all of their local health departments.  3) Find a Walk-in Clinic If your illness is not likely to be very severe or complicated, you may want to try a walk in clinic. These are popping up all over the country in pharmacies, drugstores, and shopping centers. They're usually staffed by nurse practitioners or physician assistants that have been trained to treat common illnesses and complaints.  They're usually fairly quick and inexpensive. However, if you have serious medical issues or chronic medical problems, these are probably not your best option.  No Primary Care Doctor: - Call Health Connect at  [phone] - they can help you locate a primary care doctor that  accepts your insurance, provides certain services, etc. - Physician Referral Service- [phone]  Chronic Pain Problems: Organization         Address  Phone   Notes  [facility]  [phone] Patients need to be referred by their primary care doctor.   Medication Assistance: Organization         Address  Phone   Notes  [facility] Medication [facility] [address] [place], [place] [address] [phone] --Must be a resident of [facility] -- Must have NO insurance coverage whatsoever (no Medicaid/ Medicare, etc.) -- The pt. MUST have a primary care doctor that directs their care regularly and follows them in the community   MedAssist  [phone]   [employer]  [phone]    Agencies that provide inexpensive medical care: Organization         Address  Phone   Notes  [facility]  [phone]   [facility]    [phone]   [facility] [address] [place], [place] [address] [phone]   [facility]  [address], [place] [phone]) [phone]   Planned Parenthood    [phone]   [facility]    [phone]   Community [facility] and [facility]  [address], [place] Phone:  [phone]) [phone], Fax:  [phone] Hours of Operation:  9 am - 6 pm, M-F.  Also accepts Medicaid/Medicare and self-pay.  [facility]  [address], [place] Phone: [phone], Fax: [phone]. Hours of Operation:  8:30 am - 5:30 pm, M-F.  Also accepts Medicaid and self-pay.  [facility] [place] [address], [place]  Phone: [phone]   [facility] [address], [place], [place] [phone], Ext. [phone] Mondays & Thursdays: 7-9 AM.  First 15 patients are seen on a first come, first serve basis.    Medicaid-accepting [facility] Providers:  Organization         Address  Phone   Notes  [facility] [address], [place] [phone] Also accepts self-pay patients.  [facility] [address] [place], [place]  [phone]   [facility] [address], [place] [phone]) [phone]   [facility] [address] [place] [address], [place] [phone]   Renaye RakersVeita Bland [address], [place]   [phone] Only accepts [place] Access [place]icaid patients after they have their name applied to their card.   Self-Pay (no insurance) in [facility]:  Organization         Address  Phone   Notes  Sickle Cell Patients, [facility] [address], [place] [phone]   [facility] [address], [place] [phone]) [phone]   [facility]  [address], [place] [phone]   [facility]/Dr. Osei-Bonsu  [address] [place] [address], [place] or [phone], [place] [phone] Phone number for both [place] and [place] locations is the same.  [facility]gent [facility] and [facility] [address], [place] [phone]) [phone]   [facility] [place] [address] [place] [address], [place] or [address] [phone]) [phone]   [facility] [address], [place] [phone]) [phone], phone; [phone], fax Sees patients 1st and 3rd Saturday of every month.  Must not qualify for public or private insurance (i.e. Medicaid, Medicare, [place] Health Choice, Veterans' Benefits)  Household income should be no more than 200% of the poverty level The clinic cannot  treat you if you are pregnant or think you are pregnant  Sexually transmitted diseases are not treated at the clinic.    Dental Care: Organization         Address  Phone  Notes  [facility] Department of [facility] [address], [place] [phone] Accepts children up to age 921 who are enrolled in [place]icaid or [place] Health Choice; pregnant women with a Medicaid card; and children who have applied for Medicaid or [place] Health Choice, but were declined, whose parents can pay a reduced fee at time of service.  [facility] of [facility] [place]  [address], [place] [phone] Accepts children up to age 26 who are enrolled in [place]icaid or [place] Health Choice; pregnant women with a Medicaid card; and children who have applied for  Medicaid or [place] Health Choice, but were declined, whose parents can pay a reduced fee at time of service.  [facility]  [address], [place] [phone] Patients are seen by appointment only. Walk-ins are not accepted. [facility] will see patients 26 years of age and older. Monday - Tuesday (8am-5pm) Most Wednesdays (8:30-5pm) $30 per visit, cash only  [facility]  [address], [facility] [phone] Patients are seen by appointment only. Walk-ins are not accepted. [facility] will see patients 26 years of age and older. One Wednesday Evening (Monthly: Volunteer Based).  $30 per visit, cash only  [facility]  [phone] for adults; Children under age 604, call Graduate Pediatric Dentistry at [phone]. Children aged 204-14, please call [phone] to request a pediatric application.  Dental services are provided in all areas of dental care including fillings, crowns and bridges, complete and partial dentures, implants, gum treatment, root canals, and extractions. Preventive care is also provided.  Treatment is provided to both adults and children. Patients are selected via a lottery and there is often a waiting list.   [facility] [address], [place]  [phone] [email or web address]   [facility] [address], [place], [place] [phone], Ext. [phone] Second and Fourth Thursday of each month, opens at 6:30 AM; Clinic ends at 9 AM.  Patients are seen on a first-come first-served basis, and a limited number are seen during each clinic.   [facility]  [address], [place], [place] [phone]   Eligibility Requirements You must have lived in [place], [place], or [place] counties for at least the last three months.   You cannot be eligible for state or federal sponsored National Cityhealthcare insurance, including CIGNAVeterans Administration, [place]icaid, or [employer]Medicare.   You generally cannot be eligible for healthcare insurance through your employer.    How to apply: Eligibility screenings are held every Tuesday and Wednesday afternoon from 1:00 pm until 4:00 pm. You do not need an appointment for the interview!  [facility] [address], [place], [place] [phone]   [facility]  [phone]   [facility]  [phone]   [facility]  [phone]    Behavioral Health Resources in the Community: Intensive Outpatient Programs Organization         Address  Phone  Notes  [facility] [address], [place], [place] [phone]   [facility] [address], [place], [place] [phone]   [facility] [address], [place], [place]  [phone]   [facility] [address],  [place], [place] [phone] or [phone]   Substance Abuse Resources Organization         Address  Phone  Notes  [facility]  [phone]   [facility]   [phone]   [facility]  [phone]   [facility]  [phone]   Residential & Outpatient Substance Abuse Program  [phone]   Psychological Services Organization         Address  Phone  Notes  [facility]  [phone]- [phone]   [facility]  [phone]- [phone]   [facility] [address], [place] [phone] or [phone]    Mobile Crisis Teams Organization         Address  Phone  Notes  [facility], Mobile Crisis Care Unit  [phone]   [facility]  [address] [place], [place] [phone]   [facility] [address] [place] [phone]    Self-Help/Support Groups Organization         Address  Phone             Notes  [facility] [place] - variety of support groups  [phone] [phone] Call for more information  Narcotics Anonymous (NA), [facility]aring [facility] [address], [employer] [place]  2 meetings at this location   Statistician         Address  Phone  Notes  [facility] [address],    [place]  [phone]   [facility]  [address], [place] [address], [place], [place] [phone]   [facility] [address] [place], [place] [phone] Admissions: 8am-3pm M-F  [facility] [address],    [place], [place] [phone]   [facility] [address] [place], [place], [place] [phone]   [facility] [address]  [place], [place] [phone]   [facility] - Intensive Outpatient [address], [place], [place] [phone]   [facility] ([facility] [address]  [place], [place] [phone] or [phone]   [facility] ([facility]) [address] [place], [place] [phone] Accepts Medicaid  [facility] [place] [address]  [place] [phone] Substance  Abuse/Addiction Treatment   [facility] Organization         Address  Phone  Notes  [facility]  [phone]   Angie Fava, PhD [address] [place], [place]   [phone] or [phone]   [facility]   [address] [place], [place] [phone]   [facility] [address], [place], [place] [phone] Insurance/Medicaid/sponsorship through [employer] and Families [address]                                    [place], [place] [phone] Therapy/tele-psych/case  [facility] [address], [place] [phone]    Dr. Lolly Mustache  [phone]   [facility]  [employer] [facility] Dept. 1) [address] [place] 2) [address] [place] 3)  [address], [place] [phone]  [phone]   [facility] Child Abuse Hotline [phone] or [phone] (After Hours)

## 2014-11-05 NOTE — ED Notes (Signed)
Pt transferred to Fast Track Room 6.
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
## 2014-11-05 NOTE — ED Notes (Signed)
Report given to [place] RN
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
